# Patient Record
Sex: Female | Born: 1945
Health system: Southern US, Community
[De-identification: ages and names within clinical notes are randomized; demographics above are authoritative.]

## PROBLEM LIST (undated history)

## (undated) DIAGNOSIS — D353 Benign neoplasm of craniopharyngeal duct: Secondary | ICD-10-CM

## (undated) DIAGNOSIS — Z7189 Other specified counseling: Secondary | ICD-10-CM

## (undated) DIAGNOSIS — N200 Calculus of kidney: Secondary | ICD-10-CM

## (undated) DIAGNOSIS — Z87898 Personal history of other specified conditions: Secondary | ICD-10-CM

## (undated) DIAGNOSIS — M810 Age-related osteoporosis without current pathological fracture: Secondary | ICD-10-CM

## (undated) DIAGNOSIS — Q790 Congenital diaphragmatic hernia: Secondary | ICD-10-CM

## (undated) DIAGNOSIS — Z87442 Personal history of urinary calculi: Secondary | ICD-10-CM

## (undated) DIAGNOSIS — N390 Urinary tract infection, site not specified: Secondary | ICD-10-CM

## (undated) DIAGNOSIS — N6019 Diffuse cystic mastopathy of unspecified breast: Secondary | ICD-10-CM

## (undated) DIAGNOSIS — D352 Benign neoplasm of pituitary gland: Secondary | ICD-10-CM

## (undated) DIAGNOSIS — B0222 Postherpetic trigeminal neuralgia: Secondary | ICD-10-CM

## (undated) DIAGNOSIS — Z923 Personal history of irradiation: Secondary | ICD-10-CM

## (undated) DIAGNOSIS — M1990 Unspecified osteoarthritis, unspecified site: Secondary | ICD-10-CM

## (undated) DIAGNOSIS — N270 Small kidney, unilateral: Secondary | ICD-10-CM

## (undated) DIAGNOSIS — M26629 Arthralgia of temporomandibular joint, unspecified side: Secondary | ICD-10-CM

## (undated) DIAGNOSIS — B159 Hepatitis A without hepatic coma: Secondary | ICD-10-CM

## (undated) DIAGNOSIS — N189 Chronic kidney disease, unspecified: Secondary | ICD-10-CM

## (undated) DIAGNOSIS — R3129 Other microscopic hematuria: Secondary | ICD-10-CM

## (undated) DIAGNOSIS — E039 Hypothyroidism, unspecified: Secondary | ICD-10-CM

## (undated) DIAGNOSIS — E2740 Unspecified adrenocortical insufficiency: Secondary | ICD-10-CM

## (undated) DIAGNOSIS — E7800 Pure hypercholesterolemia, unspecified: Secondary | ICD-10-CM

## (undated) DIAGNOSIS — K219 Gastro-esophageal reflux disease without esophagitis: Secondary | ICD-10-CM

## (undated) DIAGNOSIS — D419 Neoplasm of uncertain behavior of unspecified urinary organ: Secondary | ICD-10-CM

## (undated) HISTORY — DX: Congenital diaphragmatic hernia: Q79.0

## (undated) HISTORY — DX: Other specified counseling: Z71.89

## (undated) HISTORY — DX: Personal history of other specified conditions: Z87.898

## (undated) HISTORY — DX: Pure hypercholesterolemia, unspecified: E78.00

## (undated) HISTORY — PX: COLONOSCOPY: SHX174

## (undated) HISTORY — DX: Small kidney, unilateral: N27.0

## (undated) HISTORY — DX: Other microscopic hematuria: R31.29

## (undated) HISTORY — DX: Age-related osteoporosis without current pathological fracture: M81.0

## (undated) HISTORY — PX: TONSILLECTOMY: SUR1361

## (undated) HISTORY — DX: Benign neoplasm of pituitary gland: D35.2

## (undated) HISTORY — DX: Unspecified adrenocortical insufficiency: E27.40

## (undated) HISTORY — DX: Benign neoplasm of craniopharyngeal duct: D35.3

## (undated) HISTORY — PX: HERNIA REPAIR: SHX51

## (undated) HISTORY — DX: Hypothyroidism, unspecified: E03.9

## (undated) HISTORY — PX: BRAIN SURGERY: SHX531

## (undated) HISTORY — DX: Neoplasm of uncertain behavior of unspecified urinary organ: D41.9

## (undated) HISTORY — DX: Personal history of irradiation: Z92.3

## (undated) HISTORY — DX: Urinary tract infection, site not specified: N39.0

## (undated) HISTORY — DX: Calculus of kidney: N20.0

---

## 1984-10-13 HISTORY — PX: BREAST BIOPSY: SHX20

## 2003-08-14 HISTORY — PX: NEPHRECTOMY RADICAL: SUR878

## 2003-10-14 DIAGNOSIS — D352 Benign neoplasm of pituitary gland: Secondary | ICD-10-CM

## 2003-10-14 HISTORY — DX: Benign neoplasm of pituitary gland: D35.2

## 2004-03-14 ENCOUNTER — Inpatient Hospital Stay (HOSPITAL_COMMUNITY): Admission: AD | Admit: 2004-03-14 | Discharge: 2004-03-20 | Payer: Self-pay | Admitting: Neurosurgery

## 2004-03-14 ENCOUNTER — Encounter (INDEPENDENT_AMBULATORY_CARE_PROVIDER_SITE_OTHER): Payer: Self-pay | Admitting: *Deleted

## 2004-03-14 HISTORY — PX: TRANSPHENOIDAL PITUITARY RESECTION: SHX2572

## 2004-04-08 ENCOUNTER — Encounter: Admission: RE | Admit: 2004-04-08 | Discharge: 2004-04-08 | Payer: Self-pay | Admitting: Neurosurgery

## 2004-07-17 ENCOUNTER — Encounter: Admission: RE | Admit: 2004-07-17 | Discharge: 2004-07-17 | Payer: Self-pay | Admitting: Neurosurgery

## 2004-12-23 ENCOUNTER — Ambulatory Visit: Payer: Self-pay | Admitting: Urology

## 2005-05-14 ENCOUNTER — Encounter: Admission: RE | Admit: 2005-05-14 | Discharge: 2005-05-14 | Payer: Self-pay | Admitting: Neurosurgery

## 2006-03-06 ENCOUNTER — Ambulatory Visit: Payer: Self-pay | Admitting: Urology

## 2006-06-18 ENCOUNTER — Encounter: Admission: RE | Admit: 2006-06-18 | Discharge: 2006-06-18 | Payer: Self-pay | Admitting: Neurosurgery

## 2007-04-21 ENCOUNTER — Ambulatory Visit: Payer: Self-pay | Admitting: Urology

## 2007-11-08 ENCOUNTER — Ambulatory Visit: Payer: Self-pay | Admitting: Urology

## 2008-01-05 ENCOUNTER — Encounter: Admission: RE | Admit: 2008-01-05 | Discharge: 2008-01-05 | Payer: Self-pay | Admitting: Neurosurgery

## 2008-07-12 ENCOUNTER — Ambulatory Visit: Payer: Self-pay | Admitting: Urology

## 2009-07-18 ENCOUNTER — Ambulatory Visit: Payer: Self-pay | Admitting: Urology

## 2009-12-13 ENCOUNTER — Ambulatory Visit: Payer: Self-pay | Admitting: Urology

## 2010-03-25 ENCOUNTER — Encounter: Admission: RE | Admit: 2010-03-25 | Discharge: 2010-03-25 | Payer: Self-pay | Admitting: Neurosurgery

## 2010-07-24 ENCOUNTER — Ambulatory Visit: Payer: Self-pay | Admitting: Urology

## 2010-09-16 ENCOUNTER — Encounter: Admission: RE | Admit: 2010-09-16 | Discharge: 2010-09-16 | Payer: Self-pay | Admitting: Neurosurgery

## 2011-02-28 NOTE — H&P (Signed)
Brianna Hammond, Brianna Hammond                        ACCOUNT NO.:  [id number]   MEDICAL RECORD NO.:  [id number]                   PATIENT TYPE:  OIB   LOCATION:  2899                                 FACILITY:  [facility]   PHYSICIAN:  Clydene Fake, M.D.               DATE OF BIRTH:  June 13, 1946   DATE OF ADMISSION:  03/14/2004  DATE OF DISCHARGE:                                HISTORY & PHYSICAL   CHIEF COMPLAINT:  Headaches, nausea, vomiting, vision changes.   HISTORY:  The patient is a 65 year old woman who, [place] Mar [place] 2004[place] started having severe sudden-onset headache, nausea, vomiting and  vision changes, taken to a local emergency room and CT was done showing a  pituitary mass, no subarachnoid hemorrhage.  LP was done showing negative  for subarachnoid hemorrhage and she was released.  She went back home to  [place], went to her medical doctor, who got the results of the CT.  She  was having progressive worsening vision problems with blurriness to the left  temporal visual field with continued nausea, vomiting and anorexia.  An MRI  was done and the patient was sent to [place] and admitted for a large pituitary  mass, but there is a hemorrhage, considerable, with pituitary apoplexy and  compression of the chiasm.   PAST MEDICAL HISTORY:  Past medical history is significant for:  1. A renal adenoma with 1 kidney removed in November of 2004.  2. Some sinus problems.  3. Hypothyroidism.   MEDICATIONS:  Medications include aspirin, Levothroid, Evista, Pepcid,  Lorcet p.r.n. pain, Allegra and had Levaquin for the last week, stopping  yesterday.   SOCIAL HISTORY:  Social history shows she is employed at [employer], is  married, does not smoke, uses alcohol socially.   DRUG ALLERGIES:  Drug allergies include CODEINE, which causes nausea and  hallucinations.   REVIEW OF SYSTEMS:  Review of systems otherwise negative.   FAMILY HISTORY:  Family history is noncontributory.   EXAM:  NEUROLOGIC:  Patient is awake, alert and oriented x3, appropriate  affect and normal fund of knowledge.  Cranial nerves II-XII were examined  and they were intact with extraocular movements intact, pupils reactive,  face symmetric, tongue midline.  Visual fields tested and technically intact  to confrontation.  There was decreased and blurred vision to the left  temporal visual field compared to the right more medial fields.  Motor  strength and sensation intact.  Gait normal.   DATA REVIEW:  MRI of brain and sella shows a large solid mass, rim-  enhancing, extending out superiorly into the hypothalamus, compressing the  chiasm, no hydrocephalus; some mixed signal, __________ T2 consistent with  hemorrhage.   ASSESSMENT AND PLAN:  Patient with pituitary microadenoma with hemorrhage  with worsening of vision, headache, nausea, vomiting, anorexia which  continues and may be worsening.  Patient admitted for surgical  decompression.  Clydene Fake, M.D.    JRH/MEDQ  D:  03/14/2004  T:  03/15/2004  Job:  [id number]

## 2011-02-28 NOTE — Op Note (Signed)
Brianna Hammond, SAVINO                        ACCOUNT NO.:  [id number]   MEDICAL RECORD NO.:  [id number]                   PATIENT TYPE:  OIB   LOCATION:  2899                                 FACILITY:  [facility]   PHYSICIAN:  Jefry H. Pollyann Kennedy, M.D.                DATE OF BIRTH:  March 26, 1946   DATE OF PROCEDURE:  03/14/2004  DATE OF DISCHARGE:                                 OPERATIVE REPORT   PREOPERATIVE DIAGNOSIS:  Pituitary mass and pituitary apoplexy.   POSTOPERATIVE DIAGNOSIS:  Pituitary mass and pituitary apoplexy.   PROCEDURE:  Trans-septal trans-sphenoidal approach to the pituitary.   SURGEON:  Jefry H. Pollyann Kennedy, M.D.   ASSISTANT:  Clydene Fake, M.D.   COMPLICATIONS:  None.   FINDINGS:  Nasal septal deviation, large sphenoid sinus with rightward  deflection of the sphenoid septum and thinning of the bone of the posterior  sphenoid sinus, with a large mass within the sella turcica with a yellow,  slightly necrotic appearance to it.   HISTORY:  This is a 65year-old lady who presented over the past two weeks  with severe headache and progressive worsening of visual field change.  The  risks, benefits, alternatives, and complications of this portion of the  procedure were explained to the patient who seemed to understand and agreed  to the surgery.   PROCEDURE:  The patient was taken to the operating room and placed on the  operating table in supine position.  The head was positioned on the  neurosurgical table and C-arm was placed into proper positioning for lateral  fluoroscopy.  Oxymetazoline spray was used preoperatively in the nasal  cavity.  The nose was prepped and draped in a standard fashion.  A columella  incision was outlined with a marking pen and 1% Xylocaine with epinephrine  was infiltrated into the columella and the septum bilaterally as well as the  inferior turbinates.  A left hemitransfixion incision was created with the  15 scalpel.  A columella incision  was then created with a #11 scalpel.  The  lower lateral cartilage was dissected off the nasal spine and reflected with  the columella and the septum off laterally to the right side.  Mucoperichondrial flap was developed down the left side.  The bony and  cartilaginous junction was divided and a similar flap was developed on the  right side.  Large fragments of ethmoid  plate were resected with Laren Boom rongeurs and preserved for later grafting.  The posterior portion  of the quadrangular cartilage was resected, as well, as it was deviated  toward the left.  The ethmoid and vomer were taken down all the way to the  sphenoid rostrum.  The Hardy speculum was placed into position exposing the  face of the sphenoid.  The sphenoid was opened anteriorly using a 4 mm  osteotome.  The mucosa was resected.  The opening was enlarged in  all  directions using Kerrison rongeurs.  After the sinus was opened widely and  the mucosa was stripped, the patient's care was then handed over to Dr.  Colon Branch.  He performed extirpation of the tumor.  Following that, he  placed a fragment of the ethmoid perpendicular plate bone within the sella  to cover the dura and then applied Surgicel and BioGlue in several layers.  The patient's care was then handed back to myself.  The septal flaps were  quilted with 4-0 plain gut.  The mucosal incision was reapproximated with 4-  0 chromic suture and the skin columella incision was reapproximated with  interrupted 6-0 nylon.  The inferior turbinates were out fractured with a  Therapist, nutritional to allow  for more sufficient room for packing.  The nasal cavities were suctioned of  blood and secretions and then packed with rolled up Telfa coated with  Bacitracin.  The pharynx was suctioned blood under direct visualization.  The patient was then transferred to the recovery room in satisfactory  condition.                                               Jefry H.  Pollyann Kennedy, M.D.    JHR/MEDQ  D:  03/15/2004  T:  03/15/2004  Job:  [id number]

## 2011-02-28 NOTE — Discharge Summary (Signed)
Brianna Hammond, Brianna Hammond                        ACCOUNT NO.:  [id number]   MEDICAL RECORD NO.:  [id number]                   PATIENT TYPE:  INP   LOCATION:  3105                                 FACILITY:  [facility]   PHYSICIAN:  Clydene Fake, M.D.               DATE OF BIRTH:  Apr 08, 1946   DATE OF ADMISSION:  03/14/2004  DATE OF DISCHARGE:  03/20/2004                                 DISCHARGE SUMMARY   DIAGNOSIS:  Pituitary adenoma with hemorrhage and vision changes.   PROCEDURE:  Transsphenoidal resection of pituitary tumor, microdissection  with microscope.   REASON FOR ADMISSION:  The patient is a 65 year old woman who had nine days  prior to admission severe onset of headache, nausea, vomiting, progressive  vision changes in left upper field with persistent nausea, vomiting and  anorexia.  MRI was done showing large pituitary tumor __________ with  hemorrhage.  The patient brought to the hospital for surgical decompression.   HOSPITAL COURSE:  The patient was admitted on March 14, 2004 and underwent  surgery by Dr. Pollyann Kennedy and assisted in surgery __________ approach.  Post  operation she was transferred to intensive care unit.  She woke up, was  awake, alert oriented x3.  Was able to follow commands.  Extraocular  movements are intact.  Pupils reactive and visual fields were intact to  confrontation.  The patient thought maybe possibly improved preop status.  We continued to watch her in the ICU and watch fluid status and watch sodium  __________ .  They were a little bit low and she was given some fluid  replacement to increase urine output and watching the sodium.  Neurologically, she remained intact and continued __________ intermittently  __________ better.  Laboratory remained stable and she was transferred to  stepdown unit on March 18, 2004.  On the 7th the urine seemed to stabilize.  Sodium was 134 and remained stable.  She neurologically remained stable.  Was up more active.   On the 8th, sodium 141.  There was no drainage in the  nasal area.  She was on Decadron on postoperatively __________ , very slow  and wean her Decadron.  Fluid status was stable and she was discharged home  in stable condition on March 20, 2004.  Very slow wean of Decadron and  followup in the office in 2-3 weeks with no strenuous activity.  Nasal spray  p.r.n.  Call Dr. Pollyann Kennedy in two weeks.                                                Clydene Fake, M.D.    JRH/MEDQ  D:  06/13/2004  T:  06/14/2004  Job:  [id number]

## 2011-02-28 NOTE — Op Note (Signed)
NAMENEEMA, BARREIRA                        ACCOUNT NO.:  [id number]   MEDICAL RECORD NO.:  [id number]                   PATIENT TYPE:  OIB   LOCATION:  2899                                 FACILITY:  [facility]   PHYSICIAN:  Clydene Fake, M.D.               DATE OF BIRTH:  1946-03-25   DATE OF PROCEDURE:  03/14/2004  DATE OF DISCHARGE:                                 OPERATIVE REPORT   PREOPERATIVE DIAGNOSIS:  Pituitary adenoma with hemorrhage and vision  changes.   POSTOPERATIVE DIAGNOSIS:  Pituitary adenoma with hemorrhage and vision  changes.   PROCEDURE:  Trans-sphenoidal resection of pituitary tumor, microdissection  with the microscope.   CO-SURGEONS:  Clydene Fake, M.D.  Jefry H. Pollyann Kennedy, M.D.   ANESTHESIA:  General endotracheal anesthesia.   ESTIMATED BLOOD LOSS:  Nil.   BLOOD REPLACED:  None.   DRAINS:  None.   COMPLICATIONS:  None.   PATHOLOGY:  Sent for permanent sections.   REASON FOR PROCEDURE:  The patient is a 65 year old woman who, nine days  ago, had severe onset of headache, nausea and vomiting, and started having  progressive vision changes of left temporal field with persistent nausea,  vomiting, and anorexia.  MRI was done showing large pituitary tumor  compressing the chiasm with hemorrhage.  The patient was brought in for  decompressive surgery.   PROCEDURE IN DETAIL:  The patient was brought into the operating room and  general anesthesia was induced.  The patient was prepped and draped in a  sterile fashion.  The belly was also prepped and draped for possible fat  graft.  Dr. Pollyann Kennedy then performed the approach, trans-sphenoidal approach  into the sphenoid sinus.  He will dictate that under separate dictation.  A  speculum was in place and we had fluoroscopy in place.  We placed suction in  the anterior and posterior margins and we were at the bottom of the sella.  Removing the posterior mucosa of the sphenoid sinus, very thin and eroded  bone of the sella was seen.  With a small curet, this was peeled off the  dura.  The dura was coagulated with bipolar.  The dura was then opened with  a 15 blade in a cruciate fashion.  The leaves of the dura were pulled back  using a hook and bipolar.  A yellowish, cheesy appearance of the tumor was  then seen.  We used a hook to enter the tumor and it was very soft in  consistency.  Ring curets were then used to remove the tumor along with  pituitary rongeurs.  The tumor was kept for specimen and at the end of the  case, sent for permanent section.  We continued carefully removing tumor  with the various ring curets and checking with fluoroscopy for our  positioning anterior and posterior.  As we continued to remove tumor,  reddish material consistent with a pituitary  gland was seen, there was some  tumor attached to it and carefully, we dissected some of that tumor off.  There was a very thin rim of tumor of the right aspect of the pituitary  gland.  A space in the sella kept collapsing as we removed tumor and when we  were finished, we seemed to have most of the tumor out looking at the  pituitary.  Valsalva was done, there was no CSF leak.  No further tumor was  able to be found.  The wound was irrigated with antibiotic solution.  Some  Surgicel was placed over the dural edge.  A piece of septal bone was placed  anterior to the dura.  Some more Gelfoam was placed around the bone edge and  BioGlue was then used to seal the area of the sphenoid sinus.  A couple of  more pieces of Surgicel were layered over that.  BioGlue was placed over  that.  At this point, the microscope was removed from the field, the  specimen was removed.  Dr. Pollyann Kennedy finished the closure of the trans-  sphenoidal approach which will be dictated under separate dictation.  When  he was finished, the patient was awakened from anesthesia and transferred to  the recovery room in stable condition.                                                Clydene Fake, M.D.    JRH/MEDQ  D:  03/14/2004  T:  03/14/2004  Job:  [id number]

## 2011-05-12 ENCOUNTER — Other Ambulatory Visit: Payer: Self-pay | Admitting: Neurosurgery

## 2011-05-12 DIAGNOSIS — D352 Benign neoplasm of pituitary gland: Secondary | ICD-10-CM

## 2011-06-11 ENCOUNTER — Ambulatory Visit
Admission: RE | Admit: 2011-06-11 | Discharge: 2011-06-11 | Disposition: A | Discharge status: Home / Self Care | Payer: 59 | Source: Ambulatory Visit | Attending: Neurosurgery | Admitting: Neurosurgery

## 2011-06-11 DIAGNOSIS — D352 Benign neoplasm of pituitary gland: Secondary | ICD-10-CM

## 2011-06-11 MED ORDER — GADOBENATE DIMEGLUMINE 529 MG/ML IV SOLN
7.0000 mL | Freq: Once | INTRAVENOUS | Status: AC | PRN
  Administered 2011-06-11: 7 mL via INTRAVENOUS

## 2011-09-17 ENCOUNTER — Ambulatory Visit: Payer: Self-pay | Admitting: Urology

## 2012-06-02 ENCOUNTER — Other Ambulatory Visit: Payer: Self-pay | Admitting: Neurosurgery

## 2012-06-02 DIAGNOSIS — D352 Benign neoplasm of pituitary gland: Secondary | ICD-10-CM

## 2012-06-02 DIAGNOSIS — D353 Benign neoplasm of craniopharyngeal duct: Secondary | ICD-10-CM

## 2012-06-30 ENCOUNTER — Ambulatory Visit
Admission: RE | Admit: 2012-06-30 | Discharge: 2012-06-30 | Disposition: A | Discharge status: Home / Self Care | Payer: Medicare Other | Source: Ambulatory Visit | Attending: Neurosurgery | Admitting: Neurosurgery

## 2012-06-30 DIAGNOSIS — D353 Benign neoplasm of craniopharyngeal duct: Secondary | ICD-10-CM

## 2012-06-30 DIAGNOSIS — D352 Benign neoplasm of pituitary gland: Secondary | ICD-10-CM

## 2012-06-30 MED ORDER — GADOBENATE DIMEGLUMINE 529 MG/ML IV SOLN
7.0000 mL | Freq: Once | INTRAVENOUS | Status: AC | PRN
  Administered 2012-06-30: 7 mL via INTRAVENOUS

## 2012-11-15 DIAGNOSIS — D419 Neoplasm of uncertain behavior of unspecified urinary organ: Secondary | ICD-10-CM

## 2012-11-15 DIAGNOSIS — N200 Calculus of kidney: Secondary | ICD-10-CM | POA: Insufficient documentation

## 2012-11-15 DIAGNOSIS — D352 Benign neoplasm of pituitary gland: Secondary | ICD-10-CM | POA: Insufficient documentation

## 2012-11-15 DIAGNOSIS — D353 Benign neoplasm of craniopharyngeal duct: Secondary | ICD-10-CM

## 2012-11-15 DIAGNOSIS — R3129 Other microscopic hematuria: Secondary | ICD-10-CM

## 2012-11-15 DIAGNOSIS — N390 Urinary tract infection, site not specified: Secondary | ICD-10-CM | POA: Insufficient documentation

## 2012-11-15 HISTORY — DX: Benign neoplasm of pituitary gland: D35.2

## 2012-11-15 HISTORY — DX: Calculus of kidney: N20.0

## 2012-11-15 HISTORY — DX: Other microscopic hematuria: R31.29

## 2012-11-15 HISTORY — DX: Neoplasm of uncertain behavior of unspecified urinary organ: D41.9

## 2012-11-15 HISTORY — DX: Urinary tract infection, site not specified: N39.0

## 2012-11-17 ENCOUNTER — Ambulatory Visit: Payer: Self-pay | Admitting: Urology

## 2013-06-21 ENCOUNTER — Ambulatory Visit: Payer: Self-pay | Admitting: Family Medicine

## 2013-07-27 ENCOUNTER — Other Ambulatory Visit: Payer: Self-pay | Admitting: Neurosurgery

## 2013-07-27 DIAGNOSIS — D352 Benign neoplasm of pituitary gland: Secondary | ICD-10-CM

## 2013-08-04 ENCOUNTER — Ambulatory Visit
Admission: RE | Admit: 2013-08-04 | Discharge: 2013-08-04 | Disposition: A | Discharge status: Home / Self Care | Payer: Medicare Other | Source: Ambulatory Visit | Attending: Neurosurgery | Admitting: Neurosurgery

## 2013-08-04 DIAGNOSIS — D352 Benign neoplasm of pituitary gland: Secondary | ICD-10-CM

## 2013-08-04 MED ORDER — GADOBENATE DIMEGLUMINE 529 MG/ML IV SOLN
6.0000 mL | Freq: Once | INTRAVENOUS | Status: AC | PRN
  Administered 2013-08-04: 6 mL via INTRAVENOUS

## 2013-11-13 DEATH — deceased

## 2013-11-16 ENCOUNTER — Ambulatory Visit: Payer: Self-pay | Admitting: Urology

## 2014-08-16 ENCOUNTER — Ambulatory Visit: Payer: Self-pay | Admitting: Family Medicine

## 2014-10-19 ENCOUNTER — Other Ambulatory Visit: Payer: Self-pay | Admitting: Radiation Therapy

## 2014-10-19 DIAGNOSIS — D352 Benign neoplasm of pituitary gland: Secondary | ICD-10-CM

## 2014-10-26 ENCOUNTER — Encounter: Payer: Self-pay | Admitting: Radiation Oncology

## 2014-10-26 ENCOUNTER — Other Ambulatory Visit: Payer: Self-pay | Admitting: Radiation Therapy

## 2014-10-26 DIAGNOSIS — D352 Benign neoplasm of pituitary gland: Secondary | ICD-10-CM

## 2014-10-26 NOTE — Progress Notes (Signed)
Location/Histology of Brain Tumor: pituitary adenoma, dx June 2005  Patient presented with symptoms of:  sudden severe headache with nausea  Past or anticipated interventions, if any, per neurosurgery: 03/14/2004 Transsphenoidal resection of pituitary adenoma with hemorrhage and vision changes. He's been following her > 10 yrs with serial MRI's.   Past or anticipated interventions, if any, per medical oncology: none  Dose of Decadron, if applicable: na  Recent neurologic symptoms, if any:   Seizures: no  Headaches: no  Nausea: no  Dizziness/ataxia: no  Difficulty with hand coordination: no  Focal numbness/weakness: no  Visual deficits/changes: Post op- Decreased vision in right eye, post op shingles involving right eye  Confusion/Memory deficits: no  Painful bone metastases at present, if any: no  SAFETY ISSUES:  Prior radiation? no  Pacemaker/ICD? no  Possible current pregnancy? no  Is the patient on methotrexate? no  Additional Complaints / other details: originally a patient of Dr Hazle Coca; she presented with pituitary apoplexy June 2005 Married, retired from [employer] in [employer], engineering

## 2014-10-27 ENCOUNTER — Ambulatory Visit
Admission: RE | Admit: 2014-10-27 | Discharge: 2014-10-27 | Disposition: A | Discharge status: Home / Self Care | Payer: Medicare Other | Source: Ambulatory Visit | Attending: Radiation Oncology | Admitting: Radiation Oncology

## 2014-10-27 DIAGNOSIS — D352 Benign neoplasm of pituitary gland: Secondary | ICD-10-CM

## 2014-10-27 MED ORDER — GADOBENATE DIMEGLUMINE 529 MG/ML IV SOLN
10.0000 mL | Freq: Once | INTRAVENOUS | Status: AC | PRN
  Administered 2014-10-27: 10 mL via INTRAVENOUS

## 2014-10-27 NOTE — Progress Notes (Signed)
Radiation Oncology         [phone]) [phone] ________________________________  Initial outpatient Consultation  Name: Brianna Hammond MRN: [id number]  Date: 10/30/2014  DOB: 01-Nov-1945  WI:[id number],EQASTM  Hosie Spangle, MD   REFERRING PHYSICIAN: Hosie Spangle, MD  DIAGNOSIS:    ICD-9-CM ICD-10-CM   1. Pituitary adenoma 227.3 D35.2     HISTORY OF PRESENT ILLNESS::Brianna Hammond is a 69 y.o. female who presented with a pituitary adenoma, dx June 2005. She presented with symptoms of:  sudden severe headache with nausea in the middle of the night; she allow had decreased peripheral vision. She was diagnosed with pituitary apoplexy. On 03/14/2004, performed a Transsphenoidal resection of pituitary adenoma and her peripheral vision deficit immediately resolved.   She has since undergone serial MRI's.  Dr Sherwood Gambler has been following her since Dr Luiz Ochoa changed practices. Over time there has been gradual tumor progression. Is it contacting the left optic nerve.   She reports decreased vision in right eye, due to chronic problems from post op shingles involving her right eye.  My understanding from her history and review of records is that this was a non-secreting adenoma.   PREVIOUS RADIATION THERAPY: No  PAST MEDICAL HISTORY:  has a past medical history of Pituitary adenoma (2005); Kidney calculus; Trigeminal herpes zoster; TMJ syndrome; Arthritis; GERD (gastroesophageal reflux disease); Hepatitis A; Hypothyroid; Osteoporosis; and Fibrocystic breast disease.    PAST SURGICAL HISTORY: Past Surgical History  Procedure Laterality Date  . Tonsillectomy      age 34  . Breast biopsy Left 1986  . Nephrectomy radical Left 08/2003    for metanephric adenoma  . Transphenoidal pituitary resection  03/14/2004    infarcted pituitary tumor with features most suggestive of pituitary adenoma    FAMILY HISTORY: family history includes Cancer in her mother; Lymphoma in her  mother.  SOCIAL HISTORY:  reports that she has never smoked. She does not have any smokeless tobacco history on file. She reports that she drinks alcohol. She reports that she does not use illicit drugs.  ALLERGIES: Decadron  MEDICATIONS:  Current Outpatient Prescriptions  Medication Sig Dispense Refill  . Calcium-Magnesium-Vitamin D [id number] MG-MG-UNIT TB24 Take by mouth.    . Cholecalciferol 50000 UNITS capsule Take by mouth.    . Cyanocobalamin (VITAMIN B-12 CR) 1000 MCG TBCR Take by mouth.    . famotidine (PEPCID) 20 MG tablet Take 20 mg by mouth.    . levothyroxine (SYNTHROID, LEVOTHROID) 50 MCG tablet Take by mouth.    . potassium citrate (UROCIT-K) 10 MEQ (1080 MG) SR tablet Take by mouth.    . vitamin E 400 UNIT capsule Take by mouth.     No current facility-administered medications for this encounter.    REVIEW OF SYSTEMS:  Notable for that above.   PHYSICAL EXAM:  height is 5' (1.524 m) and weight is 115 lb 14.4 oz (52.572 kg). Her oral temperature is 97.7 F (36.5 C). Her blood pressure is 133/76 and her pulse is 70. Her respiration is 20.   General: Alert and oriented, in no acute distress HEENT: Head is normocephalic. Extraocular movements are intact. Oropharynx is clear. Neck: Neck is supple, no palpable cervical or supraclavicular lymphadenopathy. Heart: Regular in rate and rhythm with no murmurs, rubs, or gallops. Chest: Clear to auscultation bilaterally, with no rhonchi, wheezes, or rales. Abdomen: Soft, nontender, nondistended, with no rigidity or guarding. Extremities: No cyanosis or edema. Lymphatics: see Neck Exam Skin: No concerning lesions. Musculoskeletal: symmetric strength  and muscle tone throughout. Neurologic: Vision grossly intact in all quadrants. Cranial nerves II through XII are grossly intact. No obvious focalities. Speech is fluent. Coordination is intact. Psychiatric: Judgment and insight are intact. Affect is appropriate.    ECOG = 0  0 -  Asymptomatic (Fully active, able to carry on all predisease activities without restriction)  1 - Symptomatic but completely ambulatory (Restricted in physically strenuous activity but ambulatory and able to carry out work of a light or sedentary nature. For example, light housework, office work)  2 - Symptomatic, <50% in bed during the day (Ambulatory and capable of all self care but unable to carry out any work activities. Up and about more than 50% of waking hours)  3 - Symptomatic, >50% in bed, but not bedbound (Capable of only limited self-care, confined to bed or chair 50% or more of waking hours)  4 - Bedbound (Completely disabled. Cannot carry on any self-care. Totally confined to bed or chair)  5 - Death   Eustace Pen MM, Creech RH, Tormey DC, et al. [phone]). "Toxicity and response criteria of the [facility]". [place] Oncol. 5 (6): 649-55   LABORATORY DATA:  No results found for: WBC, HGB, HCT, MCV, PLT CMP  No results found for: NA, K, CL, CO2, GLUCOSE, BUN, CREATININE, CALCIUM, PROT, ALBUMIN, AST, ALT, ALKPHOS, BILITOT, GFRNONAA, GFRAA    No results found for: TSH  PATHOLOGY: REPORT OF SURGICAL PATHOLOGY  Case #: [id number] Patient Name: Brianna Hammond PID: [id number] Pathologist: Neldon Mc, MD DOB/Age [birth year] (Age: 44) Gender: F Date Taken: 03/14/2004 Date Received: 03/15/2004  FINAL DIAGNOSIS   MICROSCOPIC EXAMINATION AND DIAGNOSIS   PITUITARY TUMOR, EXCISION: INFARCTED PITUITARY TUMOR WITH FEATURES MOST SUGGESTIVE OF PITUITARY ADENOMA.  COMMENT ONCOLOGY TABLE-BRAIN AND SPINAL CORD  1. Maximum tumor size (cm): Unknown, needs radiographic correlation 2. Tumor location: Pituitary gland 3. Histology: Infarcted pituitary neoplasm most suggestive of pituitary adenoma 4. Grade: N/A 5. Margins (if applicable): Cannot be assessed. 6. Comment: Most of the tissue shows hemorrhagic infarction. However there are a few  fragments that show some preserved tumor cells that have a polygonal appearance and are with a prominent vascular network consistent with that seen in pituitary adenoma in the appropriate clinical setting. Therefore clinical correlation is strongly recommended. (JAS:caf 03/18/04)   RADIOGRAPHY: Mr Kizzie Fantasia Contrast  10/27/2014   CLINICAL DATA:  History of pituitary tumor resection in January of 2005, with subsequent recurrence documented more recently.  EXAM: MRI HEAD WITHOUT AND WITH CONTRAST  TECHNIQUE: Multiplanar, multiecho pulse sequences of the brain and surrounding structures were obtained without and with intravenous contrast. SRS protocol was employed.  CONTRAST:  63mL MULTIHANCE GADOBENATE DIMEGLUMINE 529 MG/ML IV SOLN  COMPARISON:  08/03/2014 MR brain most recent. Previous scans also compared From March 2009, December 2011, and October 2014.  FINDINGS: Residual/recurrent tumor within the sella turcica on the LEFT extending into the LEFT cavernous sinus is redemonstrated. RIGHT to LEFT dimensions of 11 mm, craniocaudal dimension of 8 mm, and anterior posterior dimension of 8 mm appears roughly unchanged. Slight tumor in the suprasellar cistern without chiasmatic compression, although the LEFT optic nerve appears mildly elevated. Tumor in the LEFT cavernous sinus does not significantly bulge that structure outward.  Trans-sphenoidal postop changes are redemonstrated with fat graft. Minimally deviated pituitary stalk to the RIGHT which enhances normally. Normal RIGHT cavernous sinus.  Ventricle size is normal. No acute or chronic cerebral ischemia. Mild atrophy with minimal small  vessel disease. Calvarium intact. No acute sinus or mastoid disease. Negative orbits.  IMPRESSION: Residual pituitary macroadenoma approximately 11 x 8 x 8 mm (R-L x A-P x C-C). Slight tremor in the suprasellar cistern abuts the LEFT optic nerve. The appearance is roughly similar to that of October 2015.  Slow interval  growth when compared with previous scans dating as far back is 2009 and 2011 however   Electronically Signed   By: Rolla Flatten M.D.   On: 10/27/2014 11:51      IMPRESSION/PLAN:     This is a lovely 69 yo woman with a pituitary macroadenoma, with interval progression since resection in 2005.  Marland Kitchen   After surgery in 2005 it has grown and poses future potential problems to her vision as it is in close proximity to her optic structures, particularly the left optic nerve. Further surgery is not recommended by Dr. Sherwood Gambler. He has drawn labs for her hormonal levels in his office and has discussed [place] with her as a salvage option.  I concur with his recommendation for stereotactic radiosurgery, which I would prescribe to be 25 Gy in 5 fractions, limiting the optic nerves and chiasm to 20Gy in 5 fractions, to minimize risk of visual side effects.   It was a pleasure meeting the patient today. We discussed the risks, benefits, and side effects of radiotherapy. No guarantees of treatment were given. We discussed acute side effects which could include headaches from the stereotactic mask and fatigue. We discussed other potential side effects such as temporary patchy hair loss. Long-term hormonal disturbances are not uncommon and therefore Dr Sherwood Gambler has ordered baseline lab tests. She is on levothyroxine. We talked about much less common side effects such as visual defects, brain injury. We discussed the technology that is used to minimize serious side effects. A consent form was signed and placed in the patient's medical record. The patient is enthusiastic about proceeding with treatment. I look forward to participating in the patient's care. Simulation to occur today.  __________________________________________   Eppie Gibson, MD

## 2014-10-30 ENCOUNTER — Ambulatory Visit
Admission: RE | Admit: 2014-10-30 | Discharge: 2014-10-30 | Disposition: A | Discharge status: Home / Self Care | Payer: Medicare Other | Source: Ambulatory Visit | Attending: Radiation Oncology | Admitting: Radiation Oncology

## 2014-10-30 ENCOUNTER — Encounter: Payer: Self-pay | Admitting: Radiation Oncology

## 2014-10-30 VITALS — BP 133/76 | HR 70 | Temp 97.7°F | Resp 20 | Ht 60.0 in | Wt 115.9 lb

## 2014-10-30 DIAGNOSIS — Z510 Encounter for antineoplastic radiation therapy: Secondary | ICD-10-CM | POA: Diagnosis not present

## 2014-10-30 DIAGNOSIS — Z905 Acquired absence of kidney: Secondary | ICD-10-CM | POA: Diagnosis not present

## 2014-10-30 DIAGNOSIS — D352 Benign neoplasm of pituitary gland: Secondary | ICD-10-CM | POA: Insufficient documentation

## 2014-10-30 DIAGNOSIS — Z807 Family history of other malignant neoplasms of lymphoid, hematopoietic and related tissues: Secondary | ICD-10-CM | POA: Insufficient documentation

## 2014-10-30 DIAGNOSIS — K219 Gastro-esophageal reflux disease without esophagitis: Secondary | ICD-10-CM | POA: Diagnosis not present

## 2014-10-30 DIAGNOSIS — E039 Hypothyroidism, unspecified: Secondary | ICD-10-CM | POA: Insufficient documentation

## 2014-10-30 HISTORY — DX: Diffuse cystic mastopathy of unspecified breast: N60.19

## 2014-10-30 HISTORY — DX: Benign neoplasm of pituitary gland: D35.2

## 2014-10-30 HISTORY — DX: Arthralgia of temporomandibular joint, unspecified side: M26.629

## 2014-10-30 HISTORY — DX: Postherpetic trigeminal neuralgia: B02.22

## 2014-10-30 HISTORY — DX: Gastro-esophageal reflux disease without esophagitis: K21.9

## 2014-10-30 HISTORY — DX: Hypothyroidism, unspecified: E03.9

## 2014-10-30 HISTORY — DX: Hepatitis a without hepatic coma: B15.9

## 2014-10-30 HISTORY — DX: Age-related osteoporosis without current pathological fracture: M81.0

## 2014-10-30 HISTORY — DX: Calculus of kidney: N20.0

## 2014-10-30 HISTORY — DX: Unspecified osteoarthritis, unspecified site: M19.90

## 2014-10-30 MED ORDER — SODIUM CHLORIDE 0.9 % IJ SOLN
10.0000 mL | Freq: Once | INTRAMUSCULAR | Status: AC
  Administered 2014-10-30: 10 mL via INTRAVENOUS

## 2014-10-30 NOTE — Progress Notes (Signed)
Please see the Nurse Progress Note in the MD Initial Consult Encounter for this patient. 

## 2014-10-30 NOTE — Progress Notes (Signed)
  Radiation Oncology         [phone]) [phone] ________________________________  Name: Brianna Hammond MRN: [id number]  Date: 10/30/2014  DOB: Mar 12, 1946  SIMULATION AND TREATMENT PLANNING NOTE    ICD-9-CM ICD-10-CM   1. Pituitary adenoma 227.3 D35.2     DIAGNOSIS: pituitary adenoma  NARRATIVE:  The patient was brought to the [facility].  Identity was confirmed.  All relevant records and images related to the planned course of therapy were reviewed.  The patient freely provided informed written consent to proceed with treatment after reviewing the details related to the planned course of therapy. The consent form was witnessed and verified by the simulation staff. Intravenous access was established for contrast administration. Then, the patient was set-up in a stable reproducible supine position for radiation therapy.  A relocatable thermoplastic stereotactic head frame was fabricated for precise immobilization.  CT images were obtained.  Surface markings were placed.  The CT images were loaded into the planning software and fused with the patient's targeting MRI scan.  Then the target and avoidance structures were contoured.  Treatment planning then occurred.  The radiation prescription was entered and confirmed.  I have requested 3D planning  I have requested a DVH of the following structures: Brain stem, brain, left eye, right eye, lenses, optic chiasm, target volumes, uninvolved brain, and normal tissue.    PLAN:  The patient will receive 25 Gy in 5 fractions   -----------------------------------  Eppie Gibson, MD

## 2014-10-30 NOTE — Progress Notes (Signed)
Removed right wrist 22 gauge IV. Catheter intact upon removal. Patient tolerated well. Applied an occlusive dressing to old IV site.

## 2014-11-01 DIAGNOSIS — Z510 Encounter for antineoplastic radiation therapy: Secondary | ICD-10-CM | POA: Diagnosis not present

## 2014-11-06 ENCOUNTER — Encounter: Payer: Self-pay | Admitting: Radiation Oncology

## 2014-11-06 ENCOUNTER — Ambulatory Visit
Admission: RE | Admit: 2014-11-06 | Discharge: 2014-11-06 | Disposition: A | Discharge status: Home / Self Care | Payer: Medicare Other | Source: Ambulatory Visit | Attending: Radiation Oncology | Admitting: Radiation Oncology

## 2014-11-06 VITALS — BP 125/71 | HR 66 | Temp 97.9°F | Resp 20

## 2014-11-06 DIAGNOSIS — Z510 Encounter for antineoplastic radiation therapy: Secondary | ICD-10-CM | POA: Diagnosis not present

## 2014-11-06 DIAGNOSIS — D352 Benign neoplasm of pituitary gland: Secondary | ICD-10-CM

## 2014-11-06 NOTE — Op Note (Signed)
Stereotactic Radiosurgery Operative Note  Name: Adela Esteban MRN: [id number]  Date: 11/06/2014  DOB: November 15, 1945  Op Note  Pre Operative Diagnosis:  Recurrent pituitary tumor  Post Operative Diagnois:  Recurrent pituitary tumor  3D TREATMENT PLANNING AND DOSIMETRY:  The patient's radiation plan was reviewed and approved by myself (neurosurgery) and Dr. Eppie Gibson (radiation oncology) prior to treatment.  It showed 3-dimensional radiation distributions overlaid onto the planning CT/MRI image set.  The [facility] for the target structures as well as the organs at risk were reviewed. The documentation of the 3D plan and dosimetry are filed in the radiation oncology EMR.  NARRATIVE:  Daleysa Kristiansen was brought to the TrueBeam stereotactic radiation treatment machine and placed supine on the CT couch. The head frame was applied, and the patient was set up for stereotactic radiosurgery.  I was present for the set-up and delivery.  SIMULATION VERIFICATION:  In the couch zero-angle position, the patient underwent Exactrac imaging using the Brainlab system with orthogonal KV images.  These were carefully aligned and repeated to confirm treatment position for each of the isocenters.  The Exactrac snap film verification was repeated at each couch angle.  SPECIAL TREATMENT PROCEDURE: Claudius Sis received stereotactic radiosurgery to the recurrent pituitary tumor. The tumor was treated using 3 Rapid Arc VMAT Beams to a prescription dose of 5 Gy (out of a total dose of 25 Gy divided in five 5 Gy fractions). ExacTrac registration was performed for each couch angle. The 100% isodose line was prescribed.   STEREOTACTIC TREATMENT MANAGEMENT:  Following delivery, the patient was transported to nursing in stable condition and monitored for possible acute effects.  Vital signs were recorded BP 126/88 mmHg  Pulse 64  Temp(Src) 97.9 F (36.6 C)  Resp 20. The patient tolerated treatment without  significant acute effects, and was discharged to home in stable condition.    PLAN: Follow-up in one month.

## 2014-11-06 NOTE — Progress Notes (Addendum)
Patient ID: Brianna Hammond, female   DOB: 09-30-1946, 69 y.o.   MRN: [id number]   Radiation Oncology         [phone] ________________________________  Stereotactic Treatment Procedure Note  Name: Brianna Hammond MRN: [id number]  Date: 11/06/2014  DOB: 1946/07/02  SPECIAL TREATMENT PROCEDURE    ICD-9-CM ICD-10-CM   1. Pituitary adenoma 227.3 D35.2      3D TREATMENT PLANNING AND DOSIMETRY:  The patient's radiation plan was reviewed and approved by neurosurgery and radiation oncology prior to treatment.  It showed 3-dimensional radiation distributions overlaid onto the planning CT/MRI image set.  The [facility] for the target structures as well as the organs at risk were reviewed. The documentation of the 3D plan and dosimetry are filed in the radiation oncology EMR.  NARRATIVE:  Brianna Hammond was brought to the TrueBeam stereotactic radiation treatment machine and placed supine on the CT couch. The head frame was applied, and the patient was set up for stereotactic radiosurgery.  Neurosurgery was present for the set-up and delivery  SIMULATION VERIFICATION:  In the couch zero-angle position, the patient underwent Exactrac imaging using the Brainlab system with orthogonal KV images.  These were carefully aligned and repeated to confirm treatment position for each of the isocenters.  The Exactrac snap film verification was repeated at each couch angle.  SPECIAL TREATMENT PROCEDURE: Brianna Hammond received stereotactic radiosurgery to the following targets: Pituitary adenoma target was treated using 3 Rapid Arc VMAT Beams to a prescription dose of 5 Gy (plan is to receive a total of 25 Gy in 5 fractions).  ExacTrac Snap verification was performed for each couch angle.   This constitutes a special treatment procedure due to the ablative dose delivered and the technical nature of treatment.  This highly technical modality of treatment ensures that the ablative dose is  centered on the patient's tumor while sparing normal tissues from excessive dose and risk of detrimental effects.  STEREOTACTIC TREATMENT MANAGEMENT:  Following delivery, the patient was transported to nursing in stable condition and monitored for possible acute effects.  Vital signs were recorded BP 125/71 mmHg  Pulse 66  Temp(Src) 97.9 F (36.6 C)  Resp 20. The patient tolerated treatment without significant acute effects, and was discharged to home in stable condition.    PLAN: Follow-up as scheduled for next fraction. ________________________________   Eppie Gibson, MD

## 2014-11-06 NOTE — Progress Notes (Addendum)
Patient resting quietly in recliner in rm 1 following #1 SRS treatment to pituitary gland. Husband at her side. Patient denies pain, HA, nausea, dizziness, vision changes. She states she feels "like you do when you first stand up while you are getting your bearings". She states the mask pressed on her left temple area below her ear. She has hx TMJ and states this area is slightly 'sore' from mask. VS stable. Pt to remain x 15 minutes.  3:37 pm VS stable. Pt denies pain, nausea, HA, dizziness, vision changes. She is d/c home, ambulatory. Dr Sherwood Gambler spoke with pt and spouse prior to her leaving.

## 2014-11-08 ENCOUNTER — Ambulatory Visit
Admission: RE | Admit: 2014-11-08 | Discharge: 2014-11-08 | Disposition: A | Discharge status: Home / Self Care | Payer: Medicare Other | Source: Ambulatory Visit | Attending: Radiation Oncology | Admitting: Radiation Oncology

## 2014-11-08 VITALS — BP 117/70 | HR 70 | Temp 97.8°F | Resp 20

## 2014-11-08 DIAGNOSIS — Z510 Encounter for antineoplastic radiation therapy: Secondary | ICD-10-CM | POA: Diagnosis not present

## 2014-11-08 DIAGNOSIS — D352 Benign neoplasm of pituitary gland: Secondary | ICD-10-CM

## 2014-11-08 NOTE — Progress Notes (Signed)
Patient ID: Brianna Hammond, female   DOB: 20-Feb-1946, 69 y.o.   MRN: [id number]   Radiation Oncology         [phone] ________________________________  Stereotactic Treatment Procedure Note  Name: Brianna Hammond MRN: [id number]  Date: 11/08/2014  DOB: July 04, 1946  SPECIAL TREATMENT PROCEDURE    ICD-9-CM ICD-10-CM   1. Pituitary adenoma 227.3 D35.2      3D TREATMENT PLANNING AND DOSIMETRY:  The patient's radiation plan was reviewed and approved by neurosurgery and radiation oncology prior to treatment.  It showed 3-dimensional radiation distributions overlaid onto the planning CT/MRI image set.  The [facility] for the target structures as well as the organs at risk were reviewed. The documentation of the 3D plan and dosimetry are filed in the radiation oncology EMR.  NARRATIVE:  Brianna Hammond was brought to the TrueBeam stereotactic radiation treatment machine and placed supine on the CT couch. The head frame was applied, and the patient was set up for stereotactic radiosurgery.  Neurosurgery was present for the set-up and delivery  SIMULATION VERIFICATION:  In the couch zero-angle position, the patient underwent Exactrac imaging using the Brainlab system with orthogonal KV images.  These were carefully aligned and repeated to confirm treatment position for each of the isocenters.  The Exactrac snap film verification was repeated at each couch angle.  SPECIAL TREATMENT PROCEDURE: Brianna Hammond received stereotactic radiosurgery to the following targets: Pituitary adenoma target was treated using 3 Rapid Arc VMAT Beams to a prescription dose of 5 Gy (plan is to receive a total of 25 Gy in 5 fractions).  ExacTrac Snap verification was performed for each couch angle.   This constitutes a special treatment procedure due to the ablative dose delivered and the technical nature of treatment.  This highly technical modality of treatment ensures that the ablative dose is  centered on the patient's tumor while sparing normal tissues from excessive dose and risk of detrimental effects.  STEREOTACTIC TREATMENT MANAGEMENT:  Following delivery, the patient was transported to nursing in stable condition and monitored for possible acute effects.  Vital signs were recorded BP 117/70 mmHg  Pulse 70  Temp(Src) 97.8 F (36.6 C)  Resp 20. The patient tolerated treatment without significant acute effects, and was discharged to home in stable condition.    PLAN: Follow-up as scheduled for next fraction. ________________________________   Eppie Gibson, MD

## 2014-11-08 NOTE — Progress Notes (Signed)
Patient in rm 1 following SRS treatment #2 to pituitary. She is resting quietly in the recliner. She denies pain, HA, nausea, dizziness. She states "I feel better than I did the last time." Spouse at her side. VS stable; drinking water.   3:15 pm VS stable, pt denies HA, , pain, nausea, dizziness. D/c home ambulatory; husband at her side.

## 2014-11-10 ENCOUNTER — Encounter: Payer: Self-pay | Admitting: Radiation Oncology

## 2014-11-10 ENCOUNTER — Ambulatory Visit
Admission: RE | Admit: 2014-11-10 | Discharge: 2014-11-10 | Disposition: A | Discharge status: Home / Self Care | Payer: Medicare Other | Source: Ambulatory Visit | Attending: Radiation Oncology | Admitting: Radiation Oncology

## 2014-11-10 VITALS — BP 125/71 | HR 76 | Temp 97.8°F | Resp 20

## 2014-11-10 DIAGNOSIS — Z510 Encounter for antineoplastic radiation therapy: Secondary | ICD-10-CM | POA: Diagnosis not present

## 2014-11-10 DIAGNOSIS — D352 Benign neoplasm of pituitary gland: Secondary | ICD-10-CM

## 2014-11-10 NOTE — Progress Notes (Signed)
Patient in room 2 following SRS to her pituitary. VS stable. She denies pain, HA, nausea, dizziness, vision changes, fatigue. She will remain 15 minutes. Husband by her side.

## 2014-11-13 ENCOUNTER — Ambulatory Visit
Admission: RE | Admit: 2014-11-13 | Discharge: 2014-11-13 | Disposition: A | Discharge status: Home / Self Care | Payer: Medicare Other | Source: Ambulatory Visit | Attending: Radiation Oncology | Admitting: Radiation Oncology

## 2014-11-13 VITALS — BP 131/77 | HR 79 | Temp 98.1°F

## 2014-11-13 DIAGNOSIS — Z510 Encounter for antineoplastic radiation therapy: Secondary | ICD-10-CM | POA: Diagnosis not present

## 2014-11-13 DIAGNOSIS — D352 Benign neoplasm of pituitary gland: Secondary | ICD-10-CM

## 2014-11-13 NOTE — Addendum Note (Signed)
Encounter addended by: Arlyss Repress, RN on: 11/13/2014 12:46 PM<BR>     Documentation filed: Inpatient Document Flowsheet, [place] Section

## 2014-11-13 NOTE — Addendum Note (Signed)
Encounter addended by: Arlyss Repress, RN on: 11/13/2014 12:51 PM<BR>     Documentation filed: Medications, Notes Section, Chief Complaint Section

## 2014-11-13 NOTE — Progress Notes (Signed)
Patient s/p 4 out of 5 SRS treatments.Denies pain, or nausea.No medication changes.Last treatment scheduled for Wednesday 11/15/14.Knows to call if any questions of concerns.

## 2014-11-13 NOTE — Progress Notes (Signed)
  Radiation Oncology [phone] ________________________________  Stereotactic Treatment Procedure Note  Name: Brianna Plumb MurrayMRN: [id number] Date: 1/29/2016DOB: 1945/11/16  SPECIAL TREATMENT PROCEDURE    ICD-9-CM ICD-10-CM   1. Pituitary adenoma 227.3 D35.2      3D TREATMENT PLANNING AND DOSIMETRY: The patient's radiation plan was reviewed and approved by neurosurgery and radiation oncology prior to treatment. It showed 3-dimensional radiation distributions overlaid onto the planning CT/MRI image set. The [facility] for the target structures as well as the organs at risk were reviewed. The documentation of the 3D plan and dosimetry are filed in the radiation oncology EMR.  NARRATIVE: Brianna Hammond was brought to the TrueBeam stereotactic radiation treatment machine and placed supine on the CT couch. The head frame was applied, and the patient was set up for stereotactic radiosurgery. Neurosurgery was present for the set-up and delivery  SIMULATION VERIFICATION: In the couch zero-angle position, the patient underwent Exactrac imaging using the Brainlab system with orthogonal KV images. These were carefully aligned and repeated to confirm treatment position for each of the isocenters. The Exactrac snap film verification was repeated at each couch angle.  SPECIAL TREATMENT PROCEDURE: Brianna Hammond received stereotactic radiosurgery to the following targets: Pituitary adenoma target was treated using 3 Rapid Arc VMAT Beams to a prescription dose of 5 Gy (plan is to receive a total of 25 Gy in 5 fractions). ExacTrac Snap verification was performed for each couch angle.   This constitutes a special treatment procedure due to the ablative dose delivered and the technical nature of treatment. This highly technical modality of treatment ensures that the ablative dose is centered on the patient's tumor while sparing  normal tissues from excessive dose and risk of detrimental effects.  STEREOTACTIC TREATMENT MANAGEMENT: Following delivery, the patient was transported to nursing in stable condition and monitored for possible acute effects. The patient tolerated treatment without significant acute effects, and was discharged to home in stable condition.   PLAN: Follow-up as scheduled for next fraction.

## 2014-11-13 NOTE — Progress Notes (Signed)
   Radiation Oncology (336) [phone] ________________________________  Stereotactic Treatment Procedure Note  Name: Kiara Mcdowell MurrayMRN: [id number] Date: 2/1/2016DOB: 1946-06-11  SPECIAL TREATMENT PROCEDURE    ICD-9-CM ICD-10-CM   1. Pituitary adenoma 227.3 D35.2      3D TREATMENT PLANNING AND DOSIMETRY: The patient's radiation plan was reviewed and approved by neurosurgery and radiation oncology prior to treatment. It showed 3-dimensional radiation distributions overlaid onto the planning CT/MRI image set. The [facility] for the target structures as well as the organs at risk were reviewed. The documentation of the 3D plan and dosimetry are filed in the radiation oncology EMR.  NARRATIVE: Coti Burd was brought to the TrueBeam stereotactic radiation treatment machine and placed supine on the CT couch. The head frame was applied, and the patient was set up for stereotactic radiosurgery. Neurosurgery was present for the set-up and delivery  SIMULATION VERIFICATION: In the couch zero-angle position, the patient underwent Exactrac imaging using the Brainlab system with orthogonal KV images. These were carefully aligned and repeated to confirm treatment position for each of the isocenters. The Exactrac snap film verification was repeated at each couch angle.  SPECIAL TREATMENT PROCEDURE: Claudius Sis received stereotactic radiosurgery to the following targets: Pituitary adenoma target was treated using 3 Rapid Arc VMAT Beams to a prescription dose of 5 Gy (plan is to receive a total of 25 Gy in 5 fractions). ExacTrac Snap verification was performed for each couch angle.   This constitutes a special treatment procedure due to the ablative dose delivered and the technical nature of treatment. This highly technical modality of treatment ensures that the ablative dose is centered on the patient's tumor  while sparing normal tissues from excessive dose and risk of detrimental effects.  STEREOTACTIC TREATMENT MANAGEMENT: Following delivery, the patient was transported to nursing in stable condition and monitored for possible acute effects. The patient tolerated treatment without significant acute effects, and was discharged to home in stable condition.   PLAN: Follow-up as scheduled for next fraction.

## 2014-11-15 ENCOUNTER — Encounter: Payer: Self-pay | Admitting: Radiation Oncology

## 2014-11-15 ENCOUNTER — Other Ambulatory Visit: Payer: Self-pay | Admitting: Radiation Therapy

## 2014-11-15 ENCOUNTER — Ambulatory Visit
Admission: RE | Admit: 2014-11-15 | Discharge: 2014-11-15 | Disposition: A | Discharge status: Home / Self Care | Payer: Medicare Other | Source: Ambulatory Visit | Attending: Radiation Oncology | Admitting: Radiation Oncology

## 2014-11-15 VITALS — BP 111/67 | HR 71 | Temp 97.7°F | Resp 16

## 2014-11-15 DIAGNOSIS — Z510 Encounter for antineoplastic radiation therapy: Secondary | ICD-10-CM | POA: Diagnosis not present

## 2014-11-15 DIAGNOSIS — D352 Benign neoplasm of pituitary gland: Secondary | ICD-10-CM

## 2014-11-15 NOTE — Progress Notes (Signed)
Patient denies pain, HA, nausea, dizziness, vision changes. VS stable. D/c home ambulatory, husband at her side.

## 2014-11-15 NOTE — Progress Notes (Signed)
  Radiation Oncology         [phone]) [phone] ________________________________  Stereotactic Treatment Procedure Note  Name: Brianna Hammond MRN: [id number]  Date: 11/15/2014  DOB: 04-16-1946  SPECIAL TREATMENT PROCEDURE  3D TREATMENT PLANNING AND DOSIMETRY:  The patient's radiation plan was reviewed and approved by neurosurgery and radiation oncology prior to treatment.  It showed 3-dimensional radiation distributions overlaid onto the planning CT/MRI image set.  The [facility] for the target structures as well as the organs at risk were reviewed. The documentation of the 3D plan and dosimetry are filed in the radiation oncology EMR.  NARRATIVE: Brianna Hammond was brought to the TrueBeam stereotactic radiation treatment machine and placed supine on the CT couch. The head frame was applied, and the patient was set up for stereotactic radiosurgery.  Neurosurgery was present for the set-up and delivery  SIMULATION VERIFICATION:  In the couch zero-angle position, the patient underwent Exactrac imaging using the Brainlab system with orthogonal KV images.  These were carefully aligned and repeated to confirm treatment position for each of the isocenters.  The Exactrac snap film verification was repeated at each couch angle.  SPECIAL TREATMENT PROCEDURE: Brianna Hammond received stereotactic radiosurgery to the following targets:    Pituitary adenoma target was treated using 3 Rapid Arc VMAT Beams to a prescription dose of 5 Gy (plan is to receive a total of 25 Gy in 5 fractions).  ExacTrac Snap verification was performed for each couch angle.   This constitutes a special treatment procedure due to the ablative dose delivered and the technical nature of treatment.  This highly technical modality of treatment ensures that the ablative dose is centered on the patient's tumor while sparing normal tissues from excessive dose and risk of detrimental effects.   STEREOTACTIC TREATMENT MANAGEMENT:   Following delivery, the patient was transported to nursing in stable condition and monitored for possible acute effects.  Vital signs were recorded BP 111/67 mmHg  Pulse 71  Temp(Src) 97.7 F (36.5 C) (Oral)  Resp 16  SpO2 100%. The patient tolerated treatment without significant acute effects, and was discharged to home in stable condition.    PLAN: Follow-up in one month.  ________________________________   Eppie Gibson, MD

## 2014-11-15 NOTE — Progress Notes (Signed)
S/p 5/5  SRS treatments completed, denies pain, nausea, dizziness, or vision changes, no pain, offered warm blanket and sprite , vitals wnl, monitor for 15 minutes, can cal for any unusual side effects if occurs after going home, patient did gave a headach  2 hours post SRS last week, took tylenol po prn before treatment today stated 1:23 PM

## 2014-11-20 NOTE — Progress Notes (Signed)
  Radiation Oncology         [phone]) [phone] ________________________________  Name: Greenly Rarick MRN: [id number]  Date: 11/15/2014  DOB: Oct 03, 1946  End of Treatment Note  Diagnosis:     ICD-9-CM ICD-10-CM   1. Pituitary adenoma 227.3 D35.2      Indication for treatment:  curative       Radiation treatment dates:   11/06/2014, 11/08/2014, 11/10/2014, 11/14/2014, 11/15/2014  Site/dose/Beams/energy:   Claudius Sis received stereotactic radiosurgery to the following target: Pituitary adenoma target was treated using 3 Rapid Arc VMAT Beams to a prescription dose of 5 Gy times 5 fractions to a total dose of 25 Gy. 6MV FFF photons were used.     Narrative: Ms. Garza  tolerated radiation treatment relatively well.     Plan: The patient has completed radiation treatment. The patient will return to radiation oncology clinic for routine followup in one month. I advised them to call or return sooner if they have any questions or concerns related to their recovery or treatment.  -----------------------------------  Eppie Gibson, MD

## 2014-12-06 NOTE — Progress Notes (Signed)
Patient ID: Esma Kilts, female   DOB: 03-18-1946, 69 y.o.   MRN: [id number]   Radiation Oncology         [phone]) [phone] ________________________________  Stereotactic Treatment Procedure Note Outpatient  Name: Allena Pietila MRN: [id number]  Date: 11/10/2014  DOB: 13-Dec-1945  SPECIAL TREATMENT PROCEDURE  Pituitary Adenoma D35.2  3D TREATMENT PLANNING AND DOSIMETRY:  The patient's radiation plan was reviewed and approved by neurosurgery and radiation oncology prior to treatment.  It showed 3-dimensional radiation distributions overlaid onto the planning CT/MRI image set.  The [facility] for the target structures as well as the organs at risk were reviewed. The documentation of the 3D plan and dosimetry are filed in the radiation oncology EMR.  NARRATIVE:  Dorette Hartel was brought to the TrueBeam stereotactic radiation treatment machine and placed supine on the CT couch. The head frame was applied, and the patient was set up for stereotactic radiosurgery.  Neurosurgery was present for the set-up and delivery  SIMULATION VERIFICATION:  In the couch zero-angle position, the patient underwent Exactrac imaging using the Brainlab system with orthogonal KV images.  These were carefully aligned and repeated to confirm treatment position for each of the isocenters.  The Exactrac snap film verification was repeated at each couch angle.  SPECIAL TREATMENT PROCEDURE: Claudius Sis received stereotactic radiosurgery to the following targets: Pituitary adenoma target was treated using 3 Rapid Arc VMAT Beams to a prescription dose of 5 Gy (plan is to receive a total of 25 Gy in 5 fractions).  ExacTrac Snap verification was performed for each couch angle.   This constitutes a special treatment procedure due to the ablative dose delivered and the technical nature of treatment.  This highly technical modality of treatment ensures that the ablative dose is centered on the patient's tumor  while sparing normal tissues from excessive dose and risk of detrimental effects.  STEREOTACTIC TREATMENT MANAGEMENT:  Following delivery, the patient was transported to nursing in stable condition and monitored for possible acute effects.  Vital signs were recorded . The patient tolerated treatment without significant acute effects, and was discharged to home in stable condition.    PLAN: Follow-up as scheduled for next fraction. ________________________________   Eppie Gibson, MD

## 2014-12-14 ENCOUNTER — Encounter: Payer: Self-pay | Admitting: *Deleted

## 2014-12-18 ENCOUNTER — Encounter: Payer: Self-pay | Admitting: Radiation Oncology

## 2014-12-18 ENCOUNTER — Ambulatory Visit
Admission: RE | Admit: 2014-12-18 | Discharge: 2014-12-18 | Disposition: A | Discharge status: Home / Self Care | Payer: Medicare Other | Source: Ambulatory Visit | Attending: Radiation Oncology | Admitting: Radiation Oncology

## 2014-12-18 VITALS — BP 120/69 | HR 75 | Temp 97.7°F | Resp 20 | Wt 117.0 lb

## 2014-12-18 DIAGNOSIS — D352 Benign neoplasm of pituitary gland: Secondary | ICD-10-CM

## 2014-12-18 NOTE — Progress Notes (Signed)
  Radiation Oncology         [phone]) [phone] ________________________________  Name: Brianna Hammond MRN: [id number]  Date: 12/18/2014  DOB: 06-04-1946  Follow-Up Visit Note  Outpatient  CC: Dion Body, MD  Jovita Gamma, MD  Diagnosis and Prior Radiotherapy:    ICD-9-CM ICD-10-CM   1. Pituitary adenoma 227.3 D35.2     Radiation treatment dates:   11/06/2014, 11/08/2014, 11/10/2014, 11/14/2014, 11/15/2014  Site/dose:  Pituitary Adenoma received prescription dose of 5 Gy times 5 fractions to a total dose of 25 Gy.    Narrative:  The patient returns today for routine follow-up.  Patient denies pain, HA, nausea, dizziness, vision changes, fatigue, loss of appetite. She reports HA twice during her treatments  She is not taking steroids. Vision stable.  Feels well.  ALLERGIES:  is allergic to decadron.  Meds: Current Outpatient Prescriptions  Medication Sig Dispense Refill  . Calcium-Magnesium-Vitamin D [id number] MG-MG-UNIT TB24 Take by mouth.    . Cholecalciferol 50000 UNITS capsule Take by mouth.    . Cyanocobalamin (VITAMIN B-12 CR) 1000 MCG TBCR Take by mouth.    . famotidine (PEPCID) 20 MG tablet Take 20 mg by mouth.    . levothyroxine (SYNTHROID, LEVOTHROID) 50 MCG tablet Take by mouth.    . potassium citrate (UROCIT-K) 10 MEQ (1080 MG) SR tablet Take 10 mEq by mouth daily.     . vitamin E 400 UNIT capsule Take by mouth.     No current facility-administered medications for this encounter.    Physical Findings: The patient is in no acute distress. Patient is alert and oriented.  weight is 117 lb (53.071 kg). Her temperature is 97.7 F (36.5 C). Her blood pressure is 120/69 and her pulse is 75. Her respiration is 20. Marland Kitchen   Peripheral vision intact.  Coordination, strength, gait, speech intact.  Lab Findings: No results found for: WBC, HGB, HCT, MCV, PLT  Radiographic Findings: No results found.  Impression/Plan: doing well.   I encouraged her to continue followup  with Dr Sherwood Gambler. I will see her back on an as-needed basis. Mont Dutton will verify when Dr Sherwood Gambler would like to see her back for imaging, endocrine labs, etc. We discussed the rationale of these tests. I have encouraged her to call if she has any issues or concerns in the future. She is pleased with this plan.I wished her the very best.   _____________________________________   Eppie Gibson, MD

## 2014-12-18 NOTE — Progress Notes (Addendum)
Patient denies pain, HA, nausea, dizziness, vision changes, fatigue, loss of appetite. She reports HA twice during her treatments, weakness and pain in thighs first 2 weeks following completion of treatments. She is not taking steroids. She states she woke up with mid back ache, but she attributes this to "sleeping in the same position all night". She applied OTC ointment and states she has full relief at this time.

## 2014-12-19 ENCOUNTER — Other Ambulatory Visit: Payer: Self-pay | Admitting: Radiation Therapy

## 2014-12-19 DIAGNOSIS — D352 Benign neoplasm of pituitary gland: Secondary | ICD-10-CM

## 2014-12-20 DIAGNOSIS — N270 Small kidney, unilateral: Secondary | ICD-10-CM

## 2014-12-20 HISTORY — DX: Small kidney, unilateral: N27.0

## 2015-05-07 ENCOUNTER — Other Ambulatory Visit: Payer: Medicare Other

## 2015-05-15 ENCOUNTER — Other Ambulatory Visit: Payer: Self-pay | Admitting: Radiation Therapy

## 2015-05-15 DIAGNOSIS — D352 Benign neoplasm of pituitary gland: Secondary | ICD-10-CM

## 2015-05-24 ENCOUNTER — Ambulatory Visit
Admission: RE | Admit: 2015-05-24 | Discharge: 2015-05-24 | Disposition: A | Discharge status: Home / Self Care | Payer: Medicare Other | Source: Ambulatory Visit | Attending: Radiation Oncology | Admitting: Radiation Oncology

## 2015-05-24 DIAGNOSIS — D352 Benign neoplasm of pituitary gland: Secondary | ICD-10-CM

## 2015-05-24 MED ORDER — GADOBENATE DIMEGLUMINE 529 MG/ML IV SOLN
10.0000 mL | Freq: Once | INTRAVENOUS | Status: AC | PRN
  Administered 2015-05-24: 10 mL via INTRAVENOUS

## 2016-03-11 ENCOUNTER — Other Ambulatory Visit: Payer: Self-pay | Admitting: Physician Assistant

## 2016-03-11 ENCOUNTER — Ambulatory Visit
Admission: RE | Admit: 2016-03-11 | Discharge: 2016-03-11 | Disposition: A | Discharge status: Home / Self Care | Payer: Medicare Other | Source: Ambulatory Visit | Attending: Physician Assistant | Admitting: Physician Assistant

## 2016-03-11 DIAGNOSIS — Q790 Congenital diaphragmatic hernia: Secondary | ICD-10-CM | POA: Diagnosis not present

## 2016-03-11 DIAGNOSIS — Z905 Acquired absence of kidney: Secondary | ICD-10-CM | POA: Insufficient documentation

## 2016-03-11 DIAGNOSIS — R1909 Other intra-abdominal and pelvic swelling, mass and lump: Secondary | ICD-10-CM | POA: Insufficient documentation

## 2016-03-11 DIAGNOSIS — R1903 Right lower quadrant abdominal swelling, mass and lump: Secondary | ICD-10-CM

## 2016-03-11 LAB — POCT I-STAT CREATININE: Creatinine, Ser: 1.1 mg/dL — ABNORMAL HIGH (ref 0.44–1.00)

## 2016-03-11 MED ORDER — IOPAMIDOL (ISOVUE-300) INJECTION 61%
85.0000 mL | Freq: Once | INTRAVENOUS | Status: AC | PRN
  Administered 2016-03-11: 85 mL via INTRAVENOUS

## 2016-03-25 ENCOUNTER — Other Ambulatory Visit: Payer: Self-pay | Admitting: Radiation Therapy

## 2016-03-25 DIAGNOSIS — D352 Benign neoplasm of pituitary gland: Secondary | ICD-10-CM

## 2016-04-04 ENCOUNTER — Encounter: Payer: Self-pay | Admitting: Surgery

## 2016-04-04 ENCOUNTER — Other Ambulatory Visit: Payer: Self-pay

## 2016-04-04 ENCOUNTER — Ambulatory Visit (INDEPENDENT_AMBULATORY_CARE_PROVIDER_SITE_OTHER): Payer: Medicare Other | Admitting: Surgery

## 2016-04-04 VITALS — BP 133/84 | HR 64 | Temp 98.1°F | Ht 60.0 in | Wt 131.0 lb

## 2016-04-04 DIAGNOSIS — E7800 Pure hypercholesterolemia, unspecified: Secondary | ICD-10-CM | POA: Insufficient documentation

## 2016-04-04 DIAGNOSIS — Z01818 Encounter for other preprocedural examination: Secondary | ICD-10-CM

## 2016-04-04 DIAGNOSIS — K449 Diaphragmatic hernia without obstruction or gangrene: Secondary | ICD-10-CM

## 2016-04-04 DIAGNOSIS — E039 Hypothyroidism, unspecified: Secondary | ICD-10-CM

## 2016-04-04 DIAGNOSIS — Q790 Congenital diaphragmatic hernia: Secondary | ICD-10-CM

## 2016-04-04 HISTORY — DX: Pure hypercholesterolemia, unspecified: E78.00

## 2016-04-04 HISTORY — DX: Hypothyroidism, unspecified: E03.9

## 2016-04-04 NOTE — Patient Instructions (Addendum)
We will arrange for your surgery to be done on 05/01/16. Please see your (blue) pre-care sheet provided.

## 2016-04-04 NOTE — Progress Notes (Signed)
Patient ID: Valera Preza, female   DOB: 15-Apr-1946, 70 y.o.   MRN: [id number]  History of Present Illness [name] is a 70 y.o. female easily diagnosed with the right inguinal hernia and. She reports that this first noticed a few weeks ago and she feels a lump. Part of the workup a CT scan was performed and there was evidence of a Morgagni hernia with a transverse colon in the right chest. She also had a small defect posteriorly and a second defect anteriorly. She only notices some constipation but no evidence of obstruction and no evidence of abdominal pain. She has had normal colonoscopy the last one being about 6 years ago. And she did have a history of pituitary tumor treated with excision and also gamma knife. And she also had a history of left kidney tumor and is status post left nephrectomy. Her creatinine is normal. She has good cardiovascular performance and is able to do more than 6 Mets of activity without any shortness of breath or chest pain CT personally reviewed  Past Medical History Past Medical History  Diagnosis Date  . Pituitary adenoma ([place]) 2005  . Kidney calculus   . Trigeminal herpes zoster   . TMJ syndrome   . Arthritis   . GERD (gastroesophageal reflux disease)   . Hepatitis A   . Hypothyroid   . Osteoporosis   . Fibrocystic breast disease   . History of radiation therapy 1/25, 1/27, 1/29, 2/2, 11/15/14    SRS pituitary adenoma 5 Gy in 5 fx, total 25 Gy      Past Surgical History  Procedure Laterality Date  . Tonsillectomy      age 40  . Breast biopsy Left 1986  . Nephrectomy radical Left 08/2003    for metanephric adenoma  . Transphenoidal pituitary resection  03/14/2004    infarcted pituitary tumor with features most suggestive of pituitary adenoma    Allergies  Allergen Reactions  . Decadron [Dexamethasone] Other (See Comments)    Flushing, hives    Current Outpatient Prescriptions  Medication Sig Dispense Refill  .  Calcium-Magnesium-Vitamin D [id number] MG-MG-UNIT TB24 Take by mouth.    . Cyanocobalamin (VITAMIN B-12 CR) 1000 MCG TBCR Take by mouth.    . famotidine (PEPCID) 20 MG tablet Take 20 mg by mouth.    . hydrocortisone (CORTEF) 5 MG tablet Take 1 tablet by mouth daily.    Marland Kitchen levothyroxine (SYNTHROID, LEVOTHROID) 50 MCG tablet Take by mouth.    . potassium citrate (UROCIT-K) 10 MEQ (1080 MG) SR tablet Take 10 mEq by mouth daily.     . Red Yeast Rice Extract 600 MG CAPS Take 1 tablet by mouth 1 day or 1 dose.    . vitamin E 400 UNIT capsule Take by mouth.     No current facility-administered medications for this visit.    Family History Family History  Problem Relation Age of Onset  . Cancer Mother     liver  . Lymphoma Mother      Social History Social History  Substance Use Topics  . Smoking status: Never Smoker   . Smokeless tobacco: None  . Alcohol Use: Yes     Comment: socially, rarely     ROS 10 pt ROS performed and is negative  Physical Exam Blood pressure 133/84, pulse 64, temperature 98.1 F (36.7 C), temperature source Oral, height 5' (1.524 m), weight 59.421 kg (131 lb).  CONSTITUTIONAL: NAD, well developed EYES: Pupils equal, round, and reactive  to light, Sclera non-icteric. EARS, NOSE, MOUTH AND THROAT: The oropharynx is clear. Oral mucosa is pink and moist. Hearing is intact to voice.  NECK: Trachea is midline, and there is no jugular venous distension. Thyroid is without palpable abnormalities. LYMPH NODES:  Lymph nodes in the neck are not enlarged. RESPIRATORY:  Lungs are clear, and breath sounds are equal bilaterally. Normal respiratory effort without pathologic use of accessory muscles. CARDIOVASCULAR: Heart is regular without murmurs, gallops, or rubs. GI: The abdomen is soft, nontender, and nondistended. There were no palpable masses. There was no hepatosplenomegaly. There were normal bowel sounds. There is A right Inguinal hernia, mildly tender, reducible.   MUSCULOSKELETAL:  Normal muscle strength and tone in all four extremities.    SKIN: Skin turgor is normal. There are no pathologic skin lesions.  NEUROLOGIC:  Motor and sensation is grossly normal.  Cranial nerves are grossly intact. PSYCH:  Alert and oriented to person, place and time. Affect is normal.  Data Reviewed I have personally reviewed the patient's imaging and medical records.    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 I have recommended repair given that the T colon is In her right chest. Discussed with her in detail about the procedure. We will attempt a laparoscopic repair with mesh placement. Discussed with the patient in detail about the risk, benefits and possible complications including but not limited to: Bleeding, recurrence of diaphragmatic injury, injury to adjacent structures, prolonged hospitalization. I do think that at some point We also need to perform the laparoscopic right inguinal hernia repair in a different setting.  Also discussed with our thoracic surgeon Dr. Faith Rogue and he will be assisting me in this case given the fact that her colon is in the right chest. Extensive counseling provided Caroleen Hamman, MD [place] 04/04/2016, 1:21 PM

## 2016-04-07 ENCOUNTER — Telehealth: Payer: Self-pay | Admitting: Surgery

## 2016-04-07 NOTE — Telephone Encounter (Signed)
Pt advised of pre op date/time and sx date. Sx: 05/01/16 with Dr Dahlia Byes, Dr Jetty Peeks Hernia repair.  Pre op: 04/22/16 @ 9:30am--Office.   Patient made aware to call [phone], between 1-3:00pm the day before surgery, to find out what time to arrive.

## 2016-04-22 ENCOUNTER — Encounter
Admission: RE | Admit: 2016-04-22 | Discharge: 2016-04-22 | Disposition: A | Discharge status: Home / Self Care | Payer: Medicare Other | Source: Ambulatory Visit | Attending: Surgery | Admitting: Surgery

## 2016-04-22 ENCOUNTER — Telehealth: Payer: Self-pay

## 2016-04-22 DIAGNOSIS — Z01812 Encounter for preprocedural laboratory examination: Secondary | ICD-10-CM | POA: Insufficient documentation

## 2016-04-22 DIAGNOSIS — Z01810 Encounter for preprocedural cardiovascular examination: Secondary | ICD-10-CM | POA: Insufficient documentation

## 2016-04-22 LAB — CBC
HCT: 38.6 % (ref 35.0–47.0)
HEMOGLOBIN: 13 g/dL (ref 12.0–16.0)
MCH: 28.3 pg (ref 26.0–34.0)
MCHC: 33.7 g/dL (ref 32.0–36.0)
MCV: 84.1 fL (ref 80.0–100.0)
Platelets: 165 10*3/uL (ref 150–440)
RBC: 4.6 MIL/uL (ref 3.80–5.20)
RDW: 13.5 % (ref 11.5–14.5)
WBC: 5 10*3/uL (ref 3.6–11.0)

## 2016-04-22 LAB — BASIC METABOLIC PANEL
ANION GAP: 5 (ref 5–15)
BUN: 19 mg/dL (ref 6–20)
CHLORIDE: 104 mmol/L (ref 101–111)
CO2: 29 mmol/L (ref 22–32)
Calcium: 9.3 mg/dL (ref 8.9–10.3)
Creatinine, Ser: 1.07 mg/dL — ABNORMAL HIGH (ref 0.44–1.00)
GFR calc non Af Amer: 52 mL/min — ABNORMAL LOW (ref >60)
GFR, EST AFRICAN AMERICAN: 60 mL/min — AB (ref >60)
Glucose, Bld: 88 mg/dL (ref 65–99)
Potassium: 4.1 mmol/L (ref 3.5–5.1)
Sodium: 138 mmol/L (ref 135–145)

## 2016-04-22 MED ORDER — POLYETHYLENE GLYCOL 3350 17 GM/SCOOP PO POWD: 1.0000 | Freq: Once | ORAL | Status: DC

## 2016-04-22 MED ORDER — BISACODYL 5 MG PO TBEC: 20.0000 mg | DELAYED_RELEASE_TABLET | Freq: Once | ORAL | Status: DC

## 2016-04-22 MED ORDER — ERYTHROMYCIN BASE 500 MG PO TABS: 1000.0000 mg | ORAL_TABLET | Freq: Three times a day (TID) | ORAL | Status: DC

## 2016-04-22 MED ORDER — NEOMYCIN SULFATE 500 MG PO TABS: 1000.0000 mg | ORAL_TABLET | Freq: Three times a day (TID) | ORAL | Status: DC

## 2016-04-22 NOTE — Telephone Encounter (Signed)
Patient's bowel prep sent to preferred pharmacy. Spoke with her over the phone to review instructions and placed bowel prep instructions in the mail at this time. Encouraged to call with any questions.

## 2016-04-22 NOTE — Telephone Encounter (Signed)
Patient was at Pre-admit today and bowel prep was placed on patient's orders for Pre-admission testing. Call was made to patient at this time.

## 2016-04-22 NOTE — Patient Instructions (Signed)
Your procedure is scheduled on: Thursday 05/01/16 Report to Day Surgery. 2ND FLOOR MEDICAL MALL ENTRANCE To find out your arrival time please call [phone] between 1PM - 3PM on Wednesday 04/30/16.  Remember: Instructions that are not followed completely may result in serious medical risk, up to and including death, or upon the discretion of your surgeon and anesthesiologist your surgery may need to be rescheduled.    __X__ 1. Do not eat food or drink liquids after midnight. No gum chewing or hard candies.     __X__ 2. No Alcohol for 24 hours before or after surgery.   ____ 3. Bring all medications with you on the day of surgery if instructed.    __X__ 4. Notify your doctor if there is any change in your medical condition     (cold, fever, infections).     Do not wear jewelry, make-up, hairpins, clips or nail polish.  Do not wear lotions, powders, or perfumes.   Do not shave 48 hours prior to surgery. Men may shave face and neck.  Do not bring valuables to the hospital.    [facility] is not responsible for any belongings or valuables.               Contacts, dentures or bridgework may not be worn into surgery.  Leave your suitcase in the car. After surgery it may be brought to your room.  For patients admitted to the hospital, discharge time is determined by your                treatment team.   Patients discharged the day of surgery will not be allowed to drive home.   Please read over the following fact sheets that you were given:   Surgical Site Infection Prevention   __X__ Take these medicines the morning of surgery with A SIP OF WATER:    1. PEPCID  2. LEVOTHYROXINE  3. HYDROCORTISONE  4.  5.  6.  ____ Fleet Enema (as directed)   __X__ Use CHG Soap as directed  ____ Use inhalers on the day of surgery  ____ Stop metformin 2 days prior to surgery    ____ Take 1/2 of usual insulin dose the night before surgery and none on the morning of surgery.   ____ Stop  Coumadin/Plavix/aspirin on   ____ Stop Anti-inflammatories on    __X__ Stop supplements until after surgery. B12, RED YEAST RICE   ____ Bring C-Pap to the hospital.

## 2016-04-26 NOTE — Pre-Procedure Instructions (Signed)
EKG sent to Anesthesia for review. 

## 2016-04-28 ENCOUNTER — Telehealth: Payer: Self-pay | Admitting: Surgery

## 2016-04-28 NOTE — Telephone Encounter (Signed)
Pre admit has sent a fax over requesting Medical clearance for Abnormal EKG. Patient's date of surgery is schedule for 05/01/16 with Dr Tacy Learn Laparoscopic hiatal hernia repair. Judeen Hammans called from Pre admit and stated that she has faxed a request for clearance to Dr Raylene Miyamoto office at [phone].

## 2016-04-28 NOTE — Telephone Encounter (Signed)
Patient is having repair of hiatal hernia on 7/20 with Dr Dahlia Byes. She has 2 antibiotics and would like to know when she should start taking them.

## 2016-04-28 NOTE — Telephone Encounter (Signed)
Noted. Will await clearance form

## 2016-04-28 NOTE — Pre-Procedure Instructions (Addendum)
MEDICAL CLEARANCE REQUEST/EKG,AS INSTRUCTED BY DR J ADAMS CALLED AND FAXED TO AMY AT DR PABON'S. ALSO CALLED AND FAXED TO DR Netty Starring AND SPOKE WITH DAWN. ALSO NOTIFIED PATIENT

## 2016-04-30 NOTE — Pre-Procedure Instructions (Signed)
CLEARED LOW RISK BY DR Netty Starring AND REPEAT EKG DONE

## 2016-04-30 NOTE — Telephone Encounter (Signed)
Clearance has been obtained per Anesthesia.

## 2016-05-01 ENCOUNTER — Encounter: Payer: Self-pay | Admitting: *Deleted

## 2016-05-01 ENCOUNTER — Encounter
Admission: RE | Disposition: A | Discharge status: Home / Self Care | Payer: Self-pay | Source: Ambulatory Visit | Attending: Surgery

## 2016-05-01 ENCOUNTER — Observation Stay
Admission: RE | Admit: 2016-05-01 | Discharge: 2016-05-03 | Disposition: A | Discharge status: Home / Self Care | Payer: Medicare Other | Source: Ambulatory Visit | Attending: Surgery | Admitting: Surgery

## 2016-05-01 ENCOUNTER — Inpatient Hospital Stay: Payer: Medicare Other | Admitting: Certified Registered"

## 2016-05-01 DIAGNOSIS — M26609 Unspecified temporomandibular joint disorder, unspecified side: Secondary | ICD-10-CM | POA: Insufficient documentation

## 2016-05-01 DIAGNOSIS — K449 Diaphragmatic hernia without obstruction or gangrene: Principal | ICD-10-CM

## 2016-05-01 DIAGNOSIS — Z905 Acquired absence of kidney: Secondary | ICD-10-CM | POA: Insufficient documentation

## 2016-05-01 DIAGNOSIS — M1990 Unspecified osteoarthritis, unspecified site: Secondary | ICD-10-CM | POA: Insufficient documentation

## 2016-05-01 DIAGNOSIS — Z8639 Personal history of other endocrine, nutritional and metabolic disease: Secondary | ICD-10-CM | POA: Diagnosis not present

## 2016-05-01 DIAGNOSIS — M810 Age-related osteoporosis without current pathological fracture: Secondary | ICD-10-CM | POA: Diagnosis not present

## 2016-05-01 DIAGNOSIS — K219 Gastro-esophageal reflux disease without esophagitis: Secondary | ICD-10-CM | POA: Diagnosis not present

## 2016-05-01 DIAGNOSIS — Z807 Family history of other malignant neoplasms of lymphoid, hematopoietic and related tissues: Secondary | ICD-10-CM | POA: Diagnosis not present

## 2016-05-01 DIAGNOSIS — Z79899 Other long term (current) drug therapy: Secondary | ICD-10-CM | POA: Insufficient documentation

## 2016-05-01 DIAGNOSIS — Z87442 Personal history of urinary calculi: Secondary | ICD-10-CM | POA: Insufficient documentation

## 2016-05-01 DIAGNOSIS — Z888 Allergy status to other drugs, medicaments and biological substances status: Secondary | ICD-10-CM | POA: Insufficient documentation

## 2016-05-01 DIAGNOSIS — E039 Hypothyroidism, unspecified: Secondary | ICD-10-CM | POA: Insufficient documentation

## 2016-05-01 DIAGNOSIS — K660 Peritoneal adhesions (postprocedural) (postinfection): Secondary | ICD-10-CM | POA: Insufficient documentation

## 2016-05-01 DIAGNOSIS — N6019 Diffuse cystic mastopathy of unspecified breast: Secondary | ICD-10-CM | POA: Insufficient documentation

## 2016-05-01 DIAGNOSIS — Z800 Family history of malignant neoplasm of digestive organs: Secondary | ICD-10-CM | POA: Insufficient documentation

## 2016-05-01 DIAGNOSIS — Z923 Personal history of irradiation: Secondary | ICD-10-CM | POA: Insufficient documentation

## 2016-05-01 DIAGNOSIS — Q790 Congenital diaphragmatic hernia: Secondary | ICD-10-CM | POA: Insufficient documentation

## 2016-05-01 HISTORY — PX: HIATAL HERNIA REPAIR: SHX195

## 2016-05-01 LAB — CBC
HCT: 34.1 % — ABNORMAL LOW (ref 35.0–47.0)
Hemoglobin: 11.7 g/dL — ABNORMAL LOW (ref 12.0–16.0)
MCH: 29 pg (ref 26.0–34.0)
MCHC: 34.4 g/dL (ref 32.0–36.0)
MCV: 84.2 fL (ref 80.0–100.0)
PLATELETS: 140 10*3/uL — AB (ref 150–440)
RBC: 4.05 MIL/uL (ref 3.80–5.20)
RDW: 13.2 % (ref 11.5–14.5)
WBC: 7.7 10*3/uL (ref 3.6–11.0)

## 2016-05-01 LAB — CREATININE, SERUM
CREATININE: 1.08 mg/dL — AB (ref 0.44–1.00)
GFR calc Af Amer: 59 mL/min — ABNORMAL LOW (ref >60)
GFR calc non Af Amer: 51 mL/min — ABNORMAL LOW (ref >60)

## 2016-05-01 SURGERY — REPAIR, HERNIA, HIATAL, LAPAROSCOPIC
Anesthesia: General | Wound class: Clean

## 2016-05-01 MED ORDER — ACETAMINOPHEN 10 MG/ML IV SOLN
INTRAVENOUS | Status: DC | PRN
  Administered 2016-05-01: 1000 mg via INTRAVENOUS

## 2016-05-01 MED ORDER — CEFAZOLIN SODIUM-DEXTROSE 2-4 GM/100ML-% IV SOLN
INTRAVENOUS | Status: AC
  Administered 2016-05-01: 2 g via INTRAVENOUS
  Filled 2016-05-01: qty 100

## 2016-05-01 MED ORDER — FAMOTIDINE IN NACL 20-0.9 MG/50ML-% IV SOLN
20.0000 mg | Freq: Two times a day (BID) | INTRAVENOUS | Status: DC
  Administered 2016-05-01 – 2016-05-02 (×4): 20 mg via INTRAVENOUS
  Filled 2016-05-01 (×6): qty 50

## 2016-05-01 MED ORDER — LACTATED RINGERS IV SOLN
INTRAVENOUS | Status: DC
  Administered 2016-05-01 (×2): via INTRAVENOUS

## 2016-05-01 MED ORDER — HYDROMORPHONE HCL 1 MG/ML IJ SOLN
INTRAMUSCULAR | Status: AC
  Filled 2016-05-01: qty 1

## 2016-05-01 MED ORDER — LIDOCAINE HCL (PF) 1 % IJ SOLN
INTRAMUSCULAR | Status: AC
  Filled 2016-05-01: qty 30

## 2016-05-01 MED ORDER — ONDANSETRON HCL 4 MG/2ML IJ SOLN
INTRAMUSCULAR | Status: DC | PRN
  Administered 2016-05-01: 4 mg via INTRAVENOUS

## 2016-05-01 MED ORDER — NALOXONE HCL 2 MG/2ML IJ SOSY
PREFILLED_SYRINGE | INTRAMUSCULAR | Status: AC
  Filled 2016-05-01: qty 2

## 2016-05-01 MED ORDER — BUPIVACAINE-EPINEPHRINE (PF) 0.25% -1:200000 IJ SOLN
INTRAMUSCULAR | Status: AC
Start: 2016-05-01 — End: 2016-05-01
  Filled 2016-05-01: qty 30

## 2016-05-01 MED ORDER — NEOSTIGMINE METHYLSULFATE 10 MG/10ML IV SOLN
INTRAVENOUS | Status: DC | PRN
  Administered 2016-05-01: 4 mg via INTRAVENOUS

## 2016-05-01 MED ORDER — HEPARIN SODIUM (PORCINE) 5000 UNIT/ML IJ SOLN
5000.0000 [IU] | Freq: Once | INTRAMUSCULAR | Status: AC
  Administered 2016-05-01: 5000 [IU] via SUBCUTANEOUS

## 2016-05-01 MED ORDER — LEVOTHYROXINE SODIUM 50 MCG PO TABS
50.0000 ug | ORAL_TABLET | Freq: Every day | ORAL | Status: DC
  Administered 2016-05-02 – 2016-05-03 (×2): 50 ug via ORAL
  Filled 2016-05-01 (×2): qty 1

## 2016-05-01 MED ORDER — OXYCODONE HCL 5 MG/5ML PO SOLN: 5.0000 mg | Freq: Once | ORAL | Status: DC | PRN

## 2016-05-01 MED ORDER — LACTATED RINGERS IV SOLN
INTRAVENOUS | Status: DC
  Administered 2016-05-01: 07:00:00 via INTRAVENOUS

## 2016-05-01 MED ORDER — ACETAMINOPHEN 500 MG PO TABS
1000.0000 mg | ORAL_TABLET | Freq: Four times a day (QID) | ORAL | Status: DC
  Administered 2016-05-01 – 2016-05-03 (×7): 1000 mg via ORAL
  Filled 2016-05-01 (×8): qty 2

## 2016-05-01 MED ORDER — FENTANYL CITRATE (PF) 100 MCG/2ML IJ SOLN
INTRAMUSCULAR | Status: AC
  Administered 2016-05-01: 50 ug
  Filled 2016-05-01: qty 2

## 2016-05-01 MED ORDER — LIDOCAINE HCL (PF) 1 % IJ SOLN
INTRAMUSCULAR | Status: DC | PRN
  Administered 2016-05-01: 20 mL

## 2016-05-01 MED ORDER — LIDOCAINE HCL (CARDIAC) 20 MG/ML IV SOLN
INTRAVENOUS | Status: DC | PRN
  Administered 2016-05-01: 60 mg via INTRAVENOUS

## 2016-05-01 MED ORDER — HYDROMORPHONE HCL 1 MG/ML IJ SOLN
0.2500 mg | INTRAMUSCULAR | Status: DC | PRN
  Administered 2016-05-01 (×2): 0.5 mg via INTRAVENOUS

## 2016-05-01 MED ORDER — CEFAZOLIN SODIUM-DEXTROSE 2-4 GM/100ML-% IV SOLN
2.0000 g | INTRAVENOUS | Status: AC
  Administered 2016-05-01: 2 g via INTRAVENOUS

## 2016-05-01 MED ORDER — BUPIVACAINE-EPINEPHRINE (PF) 0.25% -1:200000 IJ SOLN
INTRAMUSCULAR | Status: DC | PRN
  Administered 2016-05-01: 20 mL via PERINEURAL

## 2016-05-01 MED ORDER — NALOXONE HCL 2 MG/2ML IJ SOSY: 1.0000 mg | PREFILLED_SYRINGE | Freq: Once | INTRAMUSCULAR | Status: DC

## 2016-05-01 MED ORDER — PROPOFOL 10 MG/ML IV BOLUS
INTRAVENOUS | Status: DC | PRN
  Administered 2016-05-01: 100 mg via INTRAVENOUS

## 2016-05-01 MED ORDER — HEPARIN SODIUM (PORCINE) 5000 UNIT/ML IJ SOLN
INTRAMUSCULAR | Status: AC
  Administered 2016-05-01: 5000 [IU] via SUBCUTANEOUS
  Filled 2016-05-01: qty 1

## 2016-05-01 MED ORDER — OXYCODONE HCL 5 MG PO TABS: 5.0000 mg | ORAL_TABLET | ORAL | Status: DC | PRN

## 2016-05-01 MED ORDER — ENOXAPARIN SODIUM 40 MG/0.4ML ~~LOC~~ SOLN
40.0000 mg | SUBCUTANEOUS | Status: DC
  Administered 2016-05-02: 40 mg via SUBCUTANEOUS
  Filled 2016-05-01: qty 0.4

## 2016-05-01 MED ORDER — LORAZEPAM 2 MG/ML IJ SOLN
0.5000 mg | Freq: Once | INTRAMUSCULAR | Status: AC
  Administered 2016-05-01: 0.5 mg via INTRAVENOUS

## 2016-05-01 MED ORDER — PHENYLEPHRINE HCL 10 MG/ML IJ SOLN
INTRAMUSCULAR | Status: DC | PRN
  Administered 2016-05-01 (×6): 100 ug via INTRAVENOUS

## 2016-05-01 MED ORDER — ACETAMINOPHEN 10 MG/ML IV SOLN
INTRAVENOUS | Status: AC
  Filled 2016-05-01: qty 100

## 2016-05-01 MED ORDER — CHLORHEXIDINE GLUCONATE CLOTH 2 % EX PADS: 6.0000 | MEDICATED_PAD | Freq: Once | CUTANEOUS | Status: DC

## 2016-05-01 MED ORDER — GLYCOPYRROLATE 0.2 MG/ML IJ SOLN
INTRAMUSCULAR | Status: DC | PRN
  Administered 2016-05-01: .6 mg via INTRAVENOUS

## 2016-05-01 MED ORDER — EVICEL 5 ML EX KIT
PACK | CUTANEOUS | Status: DC | PRN
  Administered 2016-05-01: 5 mL

## 2016-05-01 MED ORDER — MEPERIDINE HCL 25 MG/ML IJ SOLN: 6.2500 mg | INTRAMUSCULAR | Status: DC | PRN

## 2016-05-01 MED ORDER — FENTANYL CITRATE (PF) 100 MCG/2ML IJ SOLN
INTRAMUSCULAR | Status: AC
  Administered 2016-05-01 (×2): 25 ug
  Filled 2016-05-01: qty 2

## 2016-05-01 MED ORDER — LORAZEPAM 2 MG/ML IJ SOLN
INTRAMUSCULAR | Status: AC
  Filled 2016-05-01: qty 1

## 2016-05-01 MED ORDER — ONDANSETRON HCL 4 MG/2ML IJ SOLN: 4.0000 mg | Freq: Four times a day (QID) | INTRAMUSCULAR | Status: DC | PRN

## 2016-05-01 MED ORDER — PROMETHAZINE HCL 25 MG/ML IJ SOLN: 6.2500 mg | INTRAMUSCULAR | Status: DC | PRN

## 2016-05-01 MED ORDER — ROCURONIUM BROMIDE 100 MG/10ML IV SOLN
INTRAVENOUS | Status: DC | PRN
  Administered 2016-05-01 (×3): 5 mg via INTRAVENOUS
  Administered 2016-05-01: 40 mg via INTRAVENOUS

## 2016-05-01 MED ORDER — KETOROLAC TROMETHAMINE 30 MG/ML IJ SOLN
INTRAMUSCULAR | Status: DC | PRN
  Administered 2016-05-01: 15 mg via INTRAVENOUS

## 2016-05-01 MED ORDER — EVICEL 5 ML EX KIT
PACK | CUTANEOUS | Status: AC
  Filled 2016-05-01: qty 1

## 2016-05-01 MED ORDER — OXYCODONE HCL 5 MG PO TABS: 5.0000 mg | ORAL_TABLET | Freq: Once | ORAL | Status: DC | PRN

## 2016-05-01 MED ORDER — FENTANYL CITRATE (PF) 100 MCG/2ML IJ SOLN
INTRAMUSCULAR | Status: DC | PRN
  Administered 2016-05-01: 25 ug via INTRAVENOUS
  Administered 2016-05-01: 50 ug via INTRAVENOUS
  Administered 2016-05-01: 100 ug via INTRAVENOUS
  Administered 2016-05-01: 25 ug via INTRAVENOUS
  Administered 2016-05-01: 50 ug via INTRAVENOUS

## 2016-05-01 MED ORDER — FENTANYL CITRATE (PF) 100 MCG/2ML IJ SOLN
25.0000 ug | INTRAMUSCULAR | Status: AC | PRN
  Administered 2016-05-01 (×6): 25 ug via INTRAVENOUS

## 2016-05-01 MED ORDER — MIDAZOLAM HCL 2 MG/2ML IJ SOLN
INTRAMUSCULAR | Status: DC | PRN
  Administered 2016-05-01 (×2): 1 mg via INTRAVENOUS

## 2016-05-01 MED ORDER — ONDANSETRON 8 MG PO TBDP: 4.0000 mg | ORAL_TABLET | Freq: Four times a day (QID) | ORAL | Status: DC | PRN

## 2016-05-01 MED ORDER — MORPHINE SULFATE (PF) 2 MG/ML IV SOLN: 2.0000 mg | INTRAVENOUS | Status: DC | PRN

## 2016-05-01 SURGICAL SUPPLY — 52 items
BLADE SURG SZ11 CARB STEEL (BLADE) ×1 IMPLANT
CANISTER SUCT 1200ML W/VALVE (MISCELLANEOUS) ×2 IMPLANT
CHLORAPREP W/TINT 26ML (MISCELLANEOUS) ×2 IMPLANT
DEFOGGER SCOPE WARMER CLEARIFY (MISCELLANEOUS) ×2 IMPLANT
DEVICE SECURE STRAP 25 ABSORB (INSTRUMENTS) ×2 IMPLANT
DEVICE SUTURE ENDOST 10MM (ENDOMECHANICALS) ×1 IMPLANT
DEVICE TROCAR PUNCTURE CLOSURE (ENDOMECHANICALS) ×2 IMPLANT
DRAPE INCISE IOBAN 66X45 STRL (DRAPES) ×2 IMPLANT
DRAPE LEGGINS SURG 28X43 STRL (DRAPES) ×1 IMPLANT
DRAPE UNDER BUTTOCK W/FLU (DRAPES) ×1 IMPLANT
ELECT CAUTERY BLADE 6.4 (BLADE) ×2 IMPLANT
GLOVE BIOGEL M 7.0 STRL (GLOVE) ×1 IMPLANT
GOWN STRL REUS W/ TWL LRG LVL3 (GOWN DISPOSABLE) ×3 IMPLANT
GOWN STRL REUS W/TWL LRG LVL3 (GOWN DISPOSABLE) ×8
GRASPER SUT TROCAR 14GX15 (MISCELLANEOUS) ×1 IMPLANT
HEMOSTAT SURGICEL 2X3 (HEMOSTASIS) ×1 IMPLANT
IRRIGATION STRYKERFLOW (MISCELLANEOUS) ×1 IMPLANT
IRRIGATOR STRYKERFLOW (MISCELLANEOUS) ×2
IV NS 1000ML (IV SOLUTION) ×2
IV NS 1000ML BAXH (IV SOLUTION) ×1 IMPLANT
KIT PINK PAD W/HEAD ARE REST (MISCELLANEOUS) ×2
KIT PINK PAD W/HEAD ARM REST (MISCELLANEOUS) IMPLANT
L-HOOK LAP DISP 36CM (ELECTROSURGICAL) ×2
LABEL OR SOLS (LABEL) ×2 IMPLANT
LHOOK LAP DISP 36CM (ELECTROSURGICAL) ×1 IMPLANT
LIQUID BAND (GAUZE/BANDAGES/DRESSINGS) ×2 IMPLANT
MESH VENTRALIGHT ST 4X6IN (Mesh General) ×1 IMPLANT
NDL SAFETY 22GX1.5 (NEEDLE) ×2 IMPLANT
NS IRRIG 500ML POUR BTL (IV SOLUTION) ×2 IMPLANT
PACK LAP CHOLECYSTECTOMY (MISCELLANEOUS) ×2 IMPLANT
PAD GROUND ADULT SPLIT (MISCELLANEOUS) ×2 IMPLANT
PENCIL ELECTRO HAND CTR (MISCELLANEOUS) ×2 IMPLANT
SCISSORS METZENBAUM CVD 33 (INSTRUMENTS) ×2 IMPLANT
SHEARS HARMONIC ACE PLUS 36CM (ENDOMECHANICALS) ×2 IMPLANT
SLEEVE ENDOPATH XCEL 5M (ENDOMECHANICALS) ×4 IMPLANT
SLEEVE PROTECTION STRL DISP (MISCELLANEOUS) ×1 IMPLANT
SUT ETHIBOND 0 36 GRN (SUTURE) ×4 IMPLANT
SUT ETHIBOND CT1 BRD #0 30IN (SUTURE) ×1 IMPLANT
SUT MNCRL 4-0 (SUTURE) ×2
SUT MNCRL 4-0 27XMFL (SUTURE) ×1
SUT VIC AB 3-0 SH 27 (SUTURE) ×2
SUT VIC AB 3-0 SH 27X BRD (SUTURE) ×1 IMPLANT
SUT VICRYL 0 AB UR-6 (SUTURE) ×5 IMPLANT
SUTURE MNCRL 4-0 27XMF (SUTURE) ×1 IMPLANT
TIP RIGID 35CM EVICEL (HEMOSTASIS) ×1 IMPLANT
TRAY FOLEY CATH SILVER 16FR LF (SET/KITS/TRAYS/PACK) ×1 IMPLANT
TROCAR 12M 150ML BLUNT (TROCAR) ×1 IMPLANT
TROCAR XCEL BLUNT TIP 100MML (ENDOMECHANICALS) ×1 IMPLANT
TROCAR XCEL NON-BLD 11X100MML (ENDOMECHANICALS) ×2 IMPLANT
TROCAR XCEL NON-BLD 5MMX100MML (ENDOMECHANICALS) ×2 IMPLANT
TROCAR XCEL UNIV SLVE 11M 100M (ENDOMECHANICALS) ×2 IMPLANT
TUBING INSUFFLATOR HEATED (MISCELLANEOUS) ×1 IMPLANT

## 2016-05-01 NOTE — Op Note (Signed)
  Pre-operative Diagnosis: Morgagni hernia  Post-operative Diagnosis: same  Procedure:  Laparoscopic enterolysis Laparoscopic repair of Morgagni Hernia with Ultralight Mesh 10x 15 cms  Surgeon: Caroleen Hamman, MD FACS  Anesthesia: Gen. with endotracheal tube  Assistant: Nestor Lewandowsky, MD   Findings: 6.5 cm Morgagni hernia  Estimated Blood Loss: 25cc         Specimens: None           Complications: none   Procedure Details  The patient was seen again in the Holding Room. The benefits, complications, treatment options, and expected outcomes were discussed with the patient. The risks of bleeding, infection, recurrence of symptoms, failure to resolve symptoms,  bowel injury,  were reviewed with the patient. The likelihood of improving the patient's symptoms with return to their baseline status is good.  The patient and/or family concurred with the proposed plan, giving informed consent.  The patient was taken to Operating Room A Time Out was held.  Prior to the induction of general anesthesia, antibiotic prophylaxis was administered. VTE prophylaxis was in place. General endotracheal anesthesia was then administered and tolerated well. After the induction, the abdomen was prepped with Chloraprep and draped in the sterile fashion. The patient was positioned in the lithotomy position.  Incision was created to the right of the  Subcostal area, Cut down technique was used to enter the abdominal cavity and a Hasson trochar was placed after two vicryl stitches were anchored to the fascia. Pneumoperitoneum was then created with CO2 and tolerated well without any adverse changes in the patient's vital signs.  Four 5-mm ports were placed under direct visualization. The Morgagni hernia was visualized, no clear evidence of Bochdalek hernia was encountered. Colon was grasped and there were some thick adhesions band holding the colon to the hernia sac, there were lysed w the harmonic scalpel in the  standard fashion. Enterolysis took about 15 minutes. The colon was reduced to the abdominal cavity. The defect was measured 6.5 cms. Trans fascial sutures were used to closed the defect primarily using the suture passer with multiple 0 ethibond sutures. We had very nice closure of the defect and the sutures were tied down. An ultralight mesh 10x15 cms was used and anchor w a trans fascial sutures in the center. Using a secure strap device the mesh was secured. On the left side we avoided the pericardial area to prevent injury to the heart or great vessels.  Evaseal was used to glue the mesh on the left side. There was a very nice coverage of the defect and I was very pleased with the result. Surgicel was used to obtain hemostasis in one of the site of the mesh. No significant bleeding encountered. Pneumoperitoneum deflated and all ports removed under direct visualization. The 12 mm port closed in a two layer fashion with 0 Vicryl sutures. 4-0 subcuticular Monocryl was used to close the skin. Dermabond was  applied.  The patient was then extubated and brought to the recovery room in stable condition. Sponge, lap, and needle counts were correct at closure and at the conclusion of the case.               Caroleen Hamman, MD, FACS

## 2016-05-01 NOTE — Anesthesia Postprocedure Evaluation (Addendum)
Anesthesia Post Note  Patient: Brianna Hammond  Procedure(s) Performed: Procedure(s) (LRB): LAPAROSCOPIC REPAIR OF MORGAGNI HERNIA (N/A)  Patient location during evaluation: PACU Anesthesia Type: General Level of consciousness: awake and alert and oriented Pain management: pain level controlled Vital Signs Assessment: post-procedure vital signs reviewed and stable Respiratory status: spontaneous breathing, nonlabored ventilation and respiratory function stable Cardiovascular status: blood pressure returned to baseline and stable Postop Assessment: no signs of nausea or vomiting Anesthetic complications: no    Last Vitals:  Filed Vitals:   05/01/16 1100 05/01/16 1105  BP: 111/57   Pulse: 69 79  Temp:  36.1 C  Resp: 15 14    Last Pain:  Filed Vitals:   05/01/16 1106  PainSc: 2                  Called to PACU at 1128, on arrival pt non responsive and desat and nurses providing bag mask ventilation, patient had most recently received dilaudid, I took over bag mask ventilation and sats improved, pulses maintained throughout, narcan given and patient responding with appropriate respiratory rate and 100% O2 sat on RA. Plan to continue to treat pain with only short acting agents  Brianna Hammond

## 2016-05-01 NOTE — Anesthesia Preprocedure Evaluation (Signed)
Anesthesia Evaluation  Patient identified by MRN, date of birth, ID band Patient awake    Reviewed: Allergy & Precautions, NPO status , Patient's Chart, lab work & pertinent test results  History of Anesthesia Complications Negative for: history of anesthetic complications  Airway Mallampati: II  TM Distance: >3 FB Neck ROM: Full    Dental no notable dental hx.    Pulmonary neg sleep apnea, neg COPD,    breath sounds clear to auscultation- rhonchi (-) wheezing      Cardiovascular Exercise Tolerance: Good (-) hypertension(-) CAD and (-) Past MI  Rhythm:Regular Rate:Normal - Systolic murmurs and - Diastolic murmurs    Neuro/Psych Hx of pituitary tumor s/p resection and radiation therapy     GI/Hepatic Neg liver ROS, hiatal hernia, GERD  Controlled,  Endo/Other  neg diabetesHypothyroidism   Renal/GU Renal disease (hx of renal tumor s/p nephrectomy)     Musculoskeletal  (+) Arthritis , Osteoarthritis,    Abdominal (+) - obese,   Peds  Hematology negative hematology ROS (+)   Anesthesia Other Findings Past Medical History:   Pituitary adenoma ([place])                         2005         Kidney calculus                                              Trigeminal herpes zoster                                     TMJ syndrome                                                 Arthritis                                                    GERD (gastroesophageal reflux disease)                       Hepatitis A                                                  Hypothyroid                                                  Osteoporosis                                                 Fibrocystic breast disease  History of radiation therapy                    1/25, 1/2*     Comment:SRS pituitary adenoma 5 Gy in 5 fx, total 25 Gy   Reproductive/Obstetrics negative OB ROS                             Anesthesia Physical Anesthesia Plan  ASA: II  Anesthesia Plan: General   Post-op Pain Management:    Induction: Intravenous  Airway Management Planned: Oral ETT  Additional Equipment:   Intra-op Plan:   Post-operative Plan: Extubation in OR  Informed Consent: I have reviewed the patients History and Physical, chart, labs and discussed the procedure including the risks, benefits and alternatives for the proposed anesthesia with the patient or authorized representative who has indicated his/her understanding and acceptance.   Dental advisory given  Plan Discussed with:   Anesthesia Plan Comments:         Anesthesia Quick Evaluation

## 2016-05-01 NOTE — H&P (View-Only) (Signed)
Patient ID: Brianna Hammond, female   DOB: 15-Apr-1946, 70 y.o.   MRN: [id number]  History of Present Illness Brianna Hammond is a 70 y.o. female easily diagnosed with the right inguinal hernia and. She reports that this first noticed a few weeks ago and she feels a lump. Part of the workup a CT scan was performed and there was evidence of a Morgagni hernia with a transverse colon in the right chest. She also had a small defect posteriorly and a second defect anteriorly. She only notices some constipation but no evidence of obstruction and no evidence of abdominal pain. She has had normal colonoscopy the last one being about 6 years ago. And she did have a history of pituitary tumor treated with excision and also gamma knife. And she also had a history of left kidney tumor and is status post left nephrectomy. Her creatinine is normal. She has good cardiovascular performance and is able to do more than 6 Mets of activity without any shortness of breath or chest pain CT personally reviewed  Past Medical History Past Medical History  Diagnosis Date  . Pituitary adenoma ([place]) 2005  . Kidney calculus   . Trigeminal herpes zoster   . TMJ syndrome   . Arthritis   . GERD (gastroesophageal reflux disease)   . Hepatitis A   . Hypothyroid   . Osteoporosis   . Fibrocystic breast disease   . History of radiation therapy 1/25, 1/27, 1/29, 2/2, 11/15/14    SRS pituitary adenoma 5 Gy in 5 fx, total 25 Gy      Past Surgical History  Procedure Laterality Date  . Tonsillectomy      age 40  . Breast biopsy Left 1986  . Nephrectomy radical Left 08/2003    for metanephric adenoma  . Transphenoidal pituitary resection  03/14/2004    infarcted pituitary tumor with features most suggestive of pituitary adenoma    Allergies  Allergen Reactions  . Decadron [Dexamethasone] Other (See Comments)    Flushing, hives    Current Outpatient Prescriptions  Medication Sig Dispense Refill  .  Calcium-Magnesium-Vitamin D [id number] MG-MG-UNIT TB24 Take by mouth.    . Cyanocobalamin (VITAMIN B-12 CR) 1000 MCG TBCR Take by mouth.    . famotidine (PEPCID) 20 MG tablet Take 20 mg by mouth.    . hydrocortisone (CORTEF) 5 MG tablet Take 1 tablet by mouth daily.    Marland Kitchen levothyroxine (SYNTHROID, LEVOTHROID) 50 MCG tablet Take by mouth.    . potassium citrate (UROCIT-K) 10 MEQ (1080 MG) SR tablet Take 10 mEq by mouth daily.     . Red Yeast Rice Extract 600 MG CAPS Take 1 tablet by mouth 1 day or 1 dose.    . vitamin E 400 UNIT capsule Take by mouth.     No current facility-administered medications for this visit.    Family History Family History  Problem Relation Age of Onset  . Cancer Mother     liver  . Lymphoma Mother      Social History Social History  Substance Use Topics  . Smoking status: Never Smoker   . Smokeless tobacco: None  . Alcohol Use: Yes     Comment: socially, rarely     ROS 10 pt ROS performed and is negative  Physical Exam Blood pressure 133/84, pulse 64, temperature 98.1 F (36.7 C), temperature source Oral, height 5' (1.524 m), weight 59.421 kg (131 lb).  CONSTITUTIONAL: NAD, well developed EYES: Pupils equal, round, and reactive  to light, Sclera non-icteric. EARS, NOSE, MOUTH AND THROAT: The oropharynx is clear. Oral mucosa is pink and moist. Hearing is intact to voice.  NECK: Trachea is midline, and there is no jugular venous distension. Thyroid is without palpable abnormalities. LYMPH NODES:  Lymph nodes in the neck are not enlarged. RESPIRATORY:  Lungs are clear, and breath sounds are equal bilaterally. Normal respiratory effort without pathologic use of accessory muscles. CARDIOVASCULAR: Heart is regular without murmurs, gallops, or rubs. GI: The abdomen is soft, nontender, and nondistended. There were no palpable masses. There was no hepatosplenomegaly. There were normal bowel sounds. There is A right Inguinal hernia, mildly tender, reducible.   MUSCULOSKELETAL:  Normal muscle strength and tone in all four extremities.    SKIN: Skin turgor is normal. There are no pathologic skin lesions.  NEUROLOGIC:  Motor and sensation is grossly normal.  Cranial nerves are grossly intact. PSYCH:  Alert and oriented to person, place and time. Affect is normal.  Data Reviewed I have personally reviewed the patient's imaging and medical records.    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 I have recommended repair given that the T colon is In her right chest. Discussed with her in detail about the procedure. We will attempt a laparoscopic repair with mesh placement. Discussed with the patient in detail about the risk, benefits and possible complications including but not limited to: Bleeding, recurrence of diaphragmatic injury, injury to adjacent structures, prolonged hospitalization. I do think that at some point We also need to perform the laparoscopic right inguinal hernia repair in a different setting.  Also discussed with our thoracic surgeon Dr. Faith Rogue and he will be assisting me in this case given the fact that her colon is in the right chest. Extensive counseling provided Caroleen Hamman, MD [place] 04/04/2016, 1:21 PM

## 2016-05-01 NOTE — Transfer of Care (Signed)
Immediate Anesthesia Transfer of Care Note  Patient: Brianna Hammond  Procedure(s) Performed: Procedure(s): LAPAROSCOPIC REPAIR OF MORGAGNI HERNIA (N/A)  Patient Location: PACU  Anesthesia Type:General  Level of Consciousness: sedated and responds to stimulation  Airway & Oxygen Therapy: Patient Spontanous Breathing and Patient connected to face mask oxygen  Post-op Assessment: Report given to RN and Post -op Vital signs reviewed and stable  Post vital signs: Reviewed and stable  Last Vitals:  Filed Vitals:   05/01/16 0616 05/01/16 1020  BP: 113/71 157/84  Pulse: 81 78  Temp: 36.6 C   Resp: 16     Last Pain: There were no vitals filed for this visit.       Complications: No apparent anesthesia complications

## 2016-05-01 NOTE — Addendum Note (Signed)
Addendum  created 05/01/16 1140 by Emmie Niemann, MD   Modules edited: Clinical Notes   Clinical Notes:  File: [id number]

## 2016-05-01 NOTE — Interval H&P Note (Signed)
History and Physical Interval Note:  05/01/2016 7:08 AM  Brianna Hammond  has presented today for surgery, with the diagnosis of Morgagni  hernia  The various methods of treatment have been discussed with the patient and family. After consideration of risks, benefits and other options for treatment, the patient has consented to  Procedure(s): Chunky (N/A) as a surgical intervention .  The patient's history has been reviewed, patient examined, no change in status, stable for surgery.  I have reviewed the patient's chart and labs.  Questions were answered to the patient's satisfaction.     [name]

## 2016-05-01 NOTE — Progress Notes (Signed)
Notified Dr. Dahlia Byes of patient not having voided within 8 hours, bladder scan volume of 113 mL and known hx of having one kidney, acknowledged by MD. Continue to monitor patient and recheck bladder in a few hours, if patient still has not voided. Pt resting in bed. Continue to assess.

## 2016-05-01 NOTE — Anesthesia Procedure Notes (Signed)
Procedure Name: Intubation Performed by: Lance Muss Pre-anesthesia Checklist: Patient identified, Patient being monitored, Timeout performed, Emergency Drugs available and Suction available Patient Re-evaluated:Patient Re-evaluated prior to inductionOxygen Delivery Method: Circle system utilized Preoxygenation: Pre-oxygenation with 100% oxygen Intubation Type: IV induction Ventilation: Mask ventilation without difficulty Laryngoscope Size: Mac, 3 and McGraph Grade View: Grade III Tube type: Oral Tube size: 7.0 mm Number of attempts: 2 Airway Equipment and Method: Stylet and Video-laryngoscopy Placement Confirmation: ETT inserted through vocal cords under direct vision,  positive ETCO2 and breath sounds checked- equal and bilateral Secured at: 20 cm Tube secured with: Tape Dental Injury: Teeth and Oropharynx as per pre-operative assessment  Difficulty Due To: Difficult Airway- due to anterior larynx, Difficulty was anticipated and Difficult Airway- due to reduced neck mobility Future Recommendations: Recommend- induction with short-acting agent, and alternative techniques readily available Comments: Limited neck mobility, mouth opening, and anterior larynx. Unsuccessful with MAC 3, had grade 3 view by CRNA. Attempted second with #3 Mcgrath had grade I view with cricoid pressure. Able to successfully pass ETT.

## 2016-05-02 DIAGNOSIS — K449 Diaphragmatic hernia without obstruction or gangrene: Secondary | ICD-10-CM | POA: Diagnosis not present

## 2016-05-02 LAB — BASIC METABOLIC PANEL
Anion gap: 5 (ref 5–15)
BUN: 10 mg/dL (ref 6–20)
CALCIUM: 8.1 mg/dL — AB (ref 8.9–10.3)
CHLORIDE: 106 mmol/L (ref 101–111)
CO2: 24 mmol/L (ref 22–32)
CREATININE: 0.97 mg/dL (ref 0.44–1.00)
GFR, EST NON AFRICAN AMERICAN: 58 mL/min — AB (ref >60)
Glucose, Bld: 97 mg/dL (ref 65–99)
Potassium: 3.8 mmol/L (ref 3.5–5.1)
SODIUM: 135 mmol/L (ref 135–145)

## 2016-05-02 LAB — CBC
HCT: 33.9 % — ABNORMAL LOW (ref 35.0–47.0)
HEMOGLOBIN: 11.8 g/dL — AB (ref 12.0–16.0)
MCH: 29 pg (ref 26.0–34.0)
MCHC: 34.9 g/dL (ref 32.0–36.0)
MCV: 83.1 fL (ref 80.0–100.0)
Platelets: 128 10*3/uL — ABNORMAL LOW (ref 150–440)
RBC: 4.07 MIL/uL (ref 3.80–5.20)
RDW: 13.6 % (ref 11.5–14.5)
WBC: 4.3 10*3/uL (ref 3.6–11.0)

## 2016-05-02 MED ORDER — KETOROLAC TROMETHAMINE 15 MG/ML IJ SOLN
15.0000 mg | Freq: Four times a day (QID) | INTRAMUSCULAR | Status: DC
  Administered 2016-05-02 – 2016-05-03 (×4): 15 mg via INTRAVENOUS
  Filled 2016-05-02 (×4): qty 1

## 2016-05-02 MED ORDER — GABAPENTIN 300 MG PO CAPS
300.0000 mg | ORAL_CAPSULE | Freq: Three times a day (TID) | ORAL | Status: DC
  Administered 2016-05-02 – 2016-05-03 (×4): 300 mg via ORAL
  Filled 2016-05-02 (×4): qty 1

## 2016-05-02 NOTE — Progress Notes (Signed)
POD # 1 s/p lap Morgagni H Repair Main issue is pain Tolerating PO AVSS Creat ok, good U/O  PE NAD Chest : cta, nsr Abd: soft, incision c/d/i, appropriate incisional tenderness , no peritonitis  A/P doing well Add gabapentin and low dose schedule toradol F/u creat in am and DC in am

## 2016-05-02 NOTE — Progress Notes (Signed)
Initial Nutrition Assessment  DOCUMENTATION CODES:   Not applicable  INTERVENTION:  -Monitor intake cater to pt preferences.  Offered to get something different for breakfast this am and pt declined.  Planning to review menu and order lunch.  -Discussed overall healthy diet encouraging lean protein for wound healing.  Pt verbalized understanding and expect good compliance.   NUTRITION DIAGNOSIS:   Inadequate oral intake related to acute illness as evidenced by per patient/family report.    GOAL:   Patient will meet greater than or equal to 90% of their needs    MONITOR:   PO intake  REASON FOR ASSESSMENT:   Malnutrition Screening Tool    ASSESSMENT:      Pt s/p Morgagni hernia repair.    Past Medical History  Diagnosis Date  . Pituitary adenoma ([place]) 2005  . Kidney calculus   . Trigeminal herpes zoster   . TMJ syndrome   . Arthritis   . GERD (gastroesophageal reflux disease)   . Hepatitis A   . Hypothyroid   . Osteoporosis   . Fibrocystic breast disease   . History of radiation therapy 1/25, 1/27, 1/29, 2/2, 11/15/14    SRS pituitary adenoma 5 Gy in 5 fx, total 25 Gy   Pt reports normal intake prior to admission.  "I eat well and variety of foods."  Only ate few bites of oatmeal this am, tasted like glue.    Medications reviewed: Labs reviewed: hgb 11.8  Diet Order:  Diet regular Room service appropriate?: Yes; Fluid consistency:: Thin  Skin:  Reviewed, no issues  Last BM:  7/19  Height:   Ht Readings from Last 1 Encounters:  05/01/16 5' (1.524 m)    Weight: Pt reports wt gain prior to admission from being on predisone. Wt encounters are reflective of that.  Wt Readings from Last 1 Encounters:  05/01/16 131 lb (59.421 kg)    Ideal Body Weight:     BMI:  Body mass index is 25.58 kg/(m^2).  Estimated Nutritional Needs:   Kcal:  1475-1770 kcals/d  Protein:  71-89 g/d  Fluid:  >/= 1483ml/d  EDUCATION NEEDS:   Education needs  addressed  Kaelen Brennan B. Zenia Resides, Reid Hope King, [place] (pager) Weekend/On-Call pager [phone])

## 2016-05-02 NOTE — Care Management Obs Status (Signed)
[facility] NOTIFICATION   Patient Details  Name: Brianna Hammond MRN: [id number] Date of Birth: July 10, 1946   Medicare Observation Status Notification Given:  Yes signed copy sent to HIM    Beverly Sessions, RN 05/02/2016, 3:35 PM

## 2016-05-02 NOTE — Care Management Important Message (Deleted)
Important Message  Patient Details  Name: SA FEHRMAN MRN: [id number] Date of Birth: 1946/02/04   Medicare Important Message Given:  Yes    Beverly Sessions, RN 05/02/2016, 10:50 AM

## 2016-05-03 DIAGNOSIS — K449 Diaphragmatic hernia without obstruction or gangrene: Secondary | ICD-10-CM | POA: Diagnosis not present

## 2016-05-03 LAB — BASIC METABOLIC PANEL
Anion gap: 6 (ref 5–15)
BUN: 12 mg/dL (ref 6–20)
CALCIUM: 8.1 mg/dL — AB (ref 8.9–10.3)
CO2: 22 mmol/L (ref 22–32)
CREATININE: 1.14 mg/dL — AB (ref 0.44–1.00)
Chloride: 103 mmol/L (ref 101–111)
GFR calc Af Amer: 56 mL/min — ABNORMAL LOW (ref >60)
GFR, EST NON AFRICAN AMERICAN: 48 mL/min — AB (ref >60)
Glucose, Bld: 81 mg/dL (ref 65–99)
Potassium: 3.9 mmol/L (ref 3.5–5.1)
Sodium: 131 mmol/L — ABNORMAL LOW (ref 135–145)

## 2016-05-03 MED ORDER — GABAPENTIN 300 MG PO CAPS: 300.0000 mg | ORAL_CAPSULE | Freq: Three times a day (TID) | ORAL | Status: DC

## 2016-05-03 MED ORDER — CYCLOBENZAPRINE HCL 5 MG PO TABS: 5.0000 mg | ORAL_TABLET | Freq: Three times a day (TID) | ORAL | Status: DC | PRN

## 2016-05-03 MED ORDER — HYDROCODONE-ACETAMINOPHEN 5-325 MG PO TABS: 1.0000 | ORAL_TABLET | Freq: Four times a day (QID) | ORAL | Status: DC | PRN

## 2016-05-03 NOTE — Progress Notes (Signed)
Patient discharged to home as ordered, IV discontinued to left hand, site without S/S of infiltration or infection. Patient instructed to call Dr. Dahlia Byes office for follow up in one week. Patient is alert and oriented, ambulates without assistance, No acute distress noted.

## 2016-05-03 NOTE — Discharge Summary (Signed)
Patient ID: SHAENA TYRELL MRN: [id number] DOB/AGE: 69-Dec-1947 69 y.o.  Admit date: 05/01/2016 Discharge date: 05/03/2016   Discharge Diagnoses:  Active Problems:   Morgagni hernia   Procedures: Laparoscopic repair of Morgagni hernia  Hospital Course: This is a very nice 70 year old female with a history of Morgagni hernia with colon contained within the sac. She underwent an laparoscopic repair with mesh placement and did very well. In the immediate postoperative period in PACU she and was very sensitive to the IV narcotics and developed some brief apnea that responded to Narcan. Postoperatively she did well all other than some pain in postoperative day #1. At the time of discharge ( POD # 2) she was ambulating, she was tolerating regular diet, her vital signs were stable. Her physical exam reveals a female in no acute distress, awake, alert. Her chest was clear to auscultation bilaterally, she was in sinus rhythm without any murmurs or gallops or rubs. Her abdomen was soft with multiple laparoscopic incisions that were healing very well without evidence of infection. No peritonitis. HEr creat and U/O were normal. \ Condition of the patient the time of discharge is stable  Disposition:   Discharge Instructions    Call MD for:  difficulty breathing, headache or visual disturbances    Complete by:  As directed      Call MD for:  extreme fatigue    Complete by:  As directed      Call MD for:  hives    Complete by:  As directed      Call MD for:  persistant dizziness or light-headedness    Complete by:  As directed      Call MD for:  persistant nausea and vomiting    Complete by:  As directed      Call MD for:  redness, tenderness, or signs of infection (pain, swelling, redness, odor or green/yellow discharge around incision site)    Complete by:  As directed      Call MD for:  severe uncontrolled pain    Complete by:  As directed      Call MD for:  temperature >100.4    Complete by:   As directed      Diet - low sodium heart healthy    Complete by:  As directed      Discharge instructions    Complete by:  As directed   Shower starting today     Increase activity slowly    Complete by:  As directed      Lifting restrictions    Complete by:  As directed   20lbs x 6 weeks     No dressing needed    Complete by:  As directed             Medication List    TAKE these medications        bisacodyl 5 MG EC tablet  Commonly known as:  DULCOLAX  Take 4 tablets (20 mg total) by mouth once.     CALCIUM CITRATE + D3 PO  Take 1 tablet by mouth daily.     cyclobenzaprine 5 MG tablet  Commonly known as:  FLEXERIL  Take 1 tablet (5 mg total) by mouth 3 (three) times daily as needed for muscle spasms.     erythromycin base 500 MG tablet  Commonly known as:  E-MYCIN  Take 2 tablets (1,000 mg total) by mouth 3 (three) times daily.     famotidine 20 MG tablet  Commonly known as:  PEPCID  Take 20 mg by mouth as needed.     gabapentin 300 MG capsule  Commonly known as:  NEURONTIN  Take 1 capsule (300 mg total) by mouth 3 (three) times daily.     HYDROcodone-acetaminophen 5-325 MG tablet  Commonly known as:  NORCO/VICODIN  Take 1-2 tablets by mouth every 6 (six) hours as needed for moderate pain.     hydrocortisone 5 MG tablet  Commonly known as:  CORTEF  Take 1.5 tablets by mouth daily.     levothyroxine 50 MCG tablet  Commonly known as:  SYNTHROID, LEVOTHROID  Take 50 mcg by mouth daily.     neomycin 500 MG tablet  Commonly known as:  MYCIFRADIN  Take 2 tablets (1,000 mg total) by mouth 3 (three) times daily.     polyethylene glycol powder powder  Commonly known as:  GLYCOLAX/MIRALAX  Take 255 g by mouth once.     potassium citrate 10 MEQ (1080 MG) SR tablet  Commonly known as:  UROCIT-K  Take 10 mEq by mouth daily.     Red Yeast Rice Extract 600 MG Caps  Take 1 tablet by mouth daily.     sodium chloride 5 % ophthalmic solution  Commonly known  as:  MURO 128  Place 1 drop into both eyes daily.     vitamin B-12 500 MCG tablet  Commonly known as:  CYANOCOBALAMIN  Take 500 mcg by mouth daily.           Follow-up Information    Follow up with Jules Husbands, MD In 1 week.   Specialty:  General Surgery   Contact information:   [address] [place] [address] [phone]        Caroleen Hamman, MD FACS

## 2016-05-07 ENCOUNTER — Encounter: Payer: Self-pay | Admitting: Surgery

## 2016-05-07 ENCOUNTER — Ambulatory Visit (INDEPENDENT_AMBULATORY_CARE_PROVIDER_SITE_OTHER): Payer: Medicare Other | Admitting: Surgery

## 2016-05-07 DIAGNOSIS — Z09 Encounter for follow-up examination after completed treatment for conditions other than malignant neoplasm: Secondary | ICD-10-CM

## 2016-05-07 NOTE — Progress Notes (Signed)
S/p lap Morgagni H repair Doing very well  tolerating Po Apparently did not fill her narcotic prescription but has not needed     PE: NAD Abd:  Incisions c/d/i, healing well no infection  A/P doing very well No heavy lifting RTC 1 month l, we will likely repair the R IH at that time and let her recover from this surgery first

## 2016-05-07 NOTE — Patient Instructions (Signed)
Please follow-up in 5 weeks as scheduled below.  Please call with any questions or concerns.

## 2016-06-09 ENCOUNTER — Ambulatory Visit
Admission: RE | Admit: 2016-06-09 | Discharge: 2016-06-09 | Disposition: A | Discharge status: Home / Self Care | Payer: Medicare Other | Source: Ambulatory Visit | Attending: Radiation Oncology | Admitting: Radiation Oncology

## 2016-06-09 DIAGNOSIS — D352 Benign neoplasm of pituitary gland: Secondary | ICD-10-CM

## 2016-06-09 MED ORDER — GADOBENATE DIMEGLUMINE 529 MG/ML IV SOLN
5.0000 mL | Freq: Once | INTRAVENOUS | Status: AC | PRN
  Administered 2016-06-09: 5 mL via INTRAVENOUS

## 2016-06-11 ENCOUNTER — Encounter: Payer: Self-pay | Admitting: Surgery

## 2016-06-12 ENCOUNTER — Encounter: Payer: Self-pay | Admitting: Surgery

## 2016-06-12 ENCOUNTER — Ambulatory Visit (INDEPENDENT_AMBULATORY_CARE_PROVIDER_SITE_OTHER): Payer: Medicare Other | Admitting: Surgery

## 2016-06-12 VITALS — BP 136/80 | HR 69 | Temp 98.6°F | Wt 125.2 lb

## 2016-06-12 DIAGNOSIS — Z09 Encounter for follow-up examination after completed treatment for conditions other than malignant neoplasm: Secondary | ICD-10-CM

## 2016-06-12 NOTE — Progress Notes (Signed)
S/p lap morgagni H repair. Doing well She has some discomfort wearing a bras an laying on her right side Had a recent appointment with neurosurgery showing evidence of good response from her pituitary tumor No other issues. Her inguinal area does not bother her  PE NAD Incisions, healed, no infection, no hernias Right IH   A/P doing well No complications Hypersthesia on the sternal area should subside with time She wishes to wait a few months before having her inguinal hernia repair.

## 2016-06-12 NOTE — Patient Instructions (Signed)
Please call with any questions or concerns.  If you would like to have your Right Inguinal Hernia repaired in the future, please let us know and this can absolutely be done at [facility] as you have requested.

## 2016-09-01 ENCOUNTER — Encounter: Payer: Self-pay | Admitting: Surgery

## 2016-09-01 ENCOUNTER — Ambulatory Visit: Payer: Medicare Other | Admitting: Surgery

## 2016-09-01 VITALS — BP 139/89 | HR 61 | Temp 97.7°F | Ht 60.0 in | Wt 128.0 lb

## 2016-09-01 DIAGNOSIS — K4090 Unilateral inguinal hernia, without obstruction or gangrene, not specified as recurrent: Secondary | ICD-10-CM

## 2016-09-01 NOTE — Progress Notes (Signed)
70 yo female f/u for [facility], she did very well after her lap Morgagni hernia repair. She has some ocasional discomfort on her right groin She rides motorcycles with he husband and has an upcoming event in the next couple of months HEre for discussion of hernia. No exam performed during this encounter. After explaining the surgery she wishes to postpone the procedure until spring of next year.  She is not having any major issues and will call us back and make an appt when she is ready. Time spent in this encounter was 15 minutes with the majority of time spent in counseling.

## 2016-09-01 NOTE — Patient Instructions (Signed)
Please call our office when you are ready to schedule your surgery.

## 2016-09-10 ENCOUNTER — Other Ambulatory Visit: Payer: Self-pay | Admitting: Family Medicine

## 2016-09-10 DIAGNOSIS — E2740 Unspecified adrenocortical insufficiency: Secondary | ICD-10-CM | POA: Insufficient documentation

## 2016-09-10 DIAGNOSIS — Z1231 Encounter for screening mammogram for malignant neoplasm of breast: Secondary | ICD-10-CM

## 2016-09-10 DIAGNOSIS — Z0000 Encounter for general adult medical examination without abnormal findings: Secondary | ICD-10-CM | POA: Insufficient documentation

## 2016-09-10 DIAGNOSIS — Z7185 Encounter for immunization safety counseling: Secondary | ICD-10-CM

## 2016-09-10 DIAGNOSIS — R7989 Other specified abnormal findings of blood chemistry: Secondary | ICD-10-CM | POA: Insufficient documentation

## 2016-09-10 HISTORY — DX: Unspecified adrenocortical insufficiency: E27.40

## 2016-09-10 HISTORY — DX: Encounter for immunization safety counseling: Z71.85

## 2016-09-10 HISTORY — DX: Other specified abnormal findings of blood chemistry: R79.89

## 2016-09-22 ENCOUNTER — Ambulatory Visit
Admission: RE | Admit: 2016-09-22 | Discharge: 2016-09-22 | Disposition: A | Discharge status: Home / Self Care | Payer: Medicare Other | Source: Ambulatory Visit | Attending: Family Medicine | Admitting: Family Medicine

## 2016-09-22 DIAGNOSIS — Z1231 Encounter for screening mammogram for malignant neoplasm of breast: Secondary | ICD-10-CM

## 2016-09-29 DIAGNOSIS — M810 Age-related osteoporosis without current pathological fracture: Secondary | ICD-10-CM

## 2016-09-29 HISTORY — DX: Age-related osteoporosis without current pathological fracture: M81.0

## 2017-06-08 ENCOUNTER — Other Ambulatory Visit: Payer: Self-pay | Admitting: Radiation Therapy

## 2017-06-08 DIAGNOSIS — D352 Benign neoplasm of pituitary gland: Secondary | ICD-10-CM

## 2017-06-18 ENCOUNTER — Ambulatory Visit
Admission: RE | Admit: 2017-06-18 | Discharge: 2017-06-18 | Disposition: A | Discharge status: Home / Self Care | Payer: Medicare Other | Source: Ambulatory Visit | Attending: Radiation Oncology | Admitting: Radiation Oncology

## 2017-06-18 DIAGNOSIS — D352 Benign neoplasm of pituitary gland: Secondary | ICD-10-CM

## 2017-06-18 MED ORDER — GADOBENATE DIMEGLUMINE 529 MG/ML IV SOLN
6.0000 mL | Freq: Once | INTRAVENOUS | Status: AC | PRN
  Administered 2017-06-18: 6 mL via INTRAVENOUS

## 2017-08-14 ENCOUNTER — Other Ambulatory Visit: Payer: Self-pay

## 2017-08-14 DIAGNOSIS — Z87898 Personal history of other specified conditions: Secondary | ICD-10-CM

## 2017-08-14 HISTORY — DX: Personal history of other specified conditions: Z87.898

## 2017-08-17 ENCOUNTER — Encounter: Payer: Self-pay | Admitting: Surgery

## 2017-08-17 ENCOUNTER — Ambulatory Visit (INDEPENDENT_AMBULATORY_CARE_PROVIDER_SITE_OTHER): Payer: Medicare Other | Admitting: Surgery

## 2017-08-17 VITALS — BP 129/87 | HR 61 | Temp 97.7°F | Ht 60.0 in | Wt 136.6 lb

## 2017-08-17 DIAGNOSIS — K4020 Bilateral inguinal hernia, without obstruction or gangrene, not specified as recurrent: Secondary | ICD-10-CM | POA: Diagnosis not present

## 2017-08-17 NOTE — Patient Instructions (Signed)
You have chose to have your hernia repaired. This will be done by Dr. Caroleen Hamman on 09/23/2017 at [facility].  Please see your (blue) Pre-care information that you have been given today.  You will need to arrange to be out of work for 2 weeks and then return with a lifting restrictions for 4 more weeks. Please send any FMLA paperwork prior to surgery and we will fill this out and fax it back to your employer within 3 business days.  You may have a bruise in your groin and also swelling and brusing in your testicle area. You may use ice 4-5 times daily for 15-20 minutes each time. Make sure that you place a barrier between you and the ice pack. To decrease the swelling, you may roll up a bath towel and place it vertically in between your thighs with your testicles resting on the towel. You will want to keep this area elevated as much as possible for several days following surgery.    Inguinal Hernia, Adult Muscles help keep everything in the body in its proper place. But if a weak spot in the muscles develops, something can poke through. That is called a hernia. When this happens in the lower part of the belly (abdomen), it is called an inguinal hernia. (It takes its name from a part of the body in this region called the inguinal canal.) A weak spot in the wall of muscles lets some fat or part of the small intestine bulge through. An inguinal hernia can develop at any age. Men get them more often than women. CAUSES  In adults, an inguinal hernia develops over time.  It can be triggered by:  Suddenly straining the muscles of the lower abdomen.  Lifting heavy objects.  Straining to have a bowel movement. Difficult bowel movements (constipation) can lead to this.  Constant coughing. This may be caused by smoking or lung disease.  Being overweight.  Being pregnant.  Working at a job that requires long periods of standing or heavy lifting.  Having had an inguinal hernia before. One type can be  an emergency situation. It is called a strangulated inguinal hernia. It develops if part of the small intestine slips through the weak spot and cannot get back into the abdomen. The blood supply can be cut off. If that happens, part of the intestine may die. This situation requires emergency surgery. SYMPTOMS  Often, a small inguinal hernia has no symptoms. It is found when a healthcare provider does a physical exam. Larger hernias usually have symptoms.   In adults, symptoms may include:  A lump in the groin. This is easier to see when the person is standing. It might disappear when lying down.  In men, a lump in the scrotum.  Pain or burning in the groin. This occurs especially when lifting, straining or coughing.  A dull ache or feeling of pressure in the groin.  Signs of a strangulated hernia can include:  A bulge in the groin that becomes very painful and tender to the touch.  A bulge that turns red or purple.  Fever, nausea and vomiting.  Inability to have a bowel movement or to pass gas. DIAGNOSIS  To decide if you have an inguinal hernia, a healthcare provider will probably do a physical examination.  This will include asking questions about any symptoms you have noticed.  The healthcare provider might feel the groin area and ask you to cough. If an inguinal hernia is felt, the healthcare  provider may try to slide it back into the abdomen.  Usually no other tests are needed. TREATMENT  Treatments can vary. The size of the hernia makes a difference. Options include:  Watchful waiting. This is often suggested if the hernia is small and you have had no symptoms.  No medical procedure will be done unless symptoms develop.  You will need to watch closely for symptoms. If any occur, contact your healthcare provider right away.  Surgery. This is used if the hernia is larger or you have symptoms.  Open surgery. This is usually an outpatient procedure (you will not stay  overnight in a hospital). An cut (incision) is made through the skin in the groin. The hernia is put back inside the abdomen. The weak area in the muscles is then repaired by herniorrhaphy or hernioplasty. Herniorrhaphy: in this type of surgery, the weak muscles are sewn back together. Hernioplasty: a patch or mesh is used to close the weak area in the abdominal wall.  Laparoscopy. In this procedure, a surgeon makes small incisions. A thin tube with a tiny video camera (called a laparoscope) is put into the abdomen. The surgeon repairs the hernia with mesh by looking with the video camera and using two long instruments. HOME CARE INSTRUCTIONS   After surgery to repair an inguinal hernia:  You will need to take pain medicine prescribed by your healthcare provider. Follow all directions carefully.  You will need to take care of the wound from the incision.  Your activity will be restricted for awhile. This will probably include no heavy lifting for several weeks. You also should not do anything too active for a few weeks. When you can return to work will depend on the type of job that you have.  During "watchful waiting" periods, you should:  Maintain a healthy weight.  Eat a diet high in fiber (fruits, vegetables and whole grains).  Drink plenty of fluids to avoid constipation. This means drinking enough water and other liquids to keep your urine clear or pale yellow.  Do not lift heavy objects.  Do not stand for long periods of time.  Quit smoking. This should keep you from developing a frequent cough. SEEK MEDICAL CARE IF:   A bulge develops in your groin area.  You feel pain, a burning sensation or pressure in the groin. This might be worse if you are lifting or straining.  You develop a fever of more than 100.5 F (38.1 C). SEEK IMMEDIATE MEDICAL CARE IF:   Pain in the groin increases suddenly.  A bulge in the groin gets bigger suddenly and does not go down.  For men, there  is sudden pain in the scrotum. Or, the size of the scrotum increases.  A bulge in the groin area becomes red or purple and is painful to touch.  You have nausea or vomiting that does not go away.  You feel your heart beating much faster than normal.  You cannot have a bowel movement or pass gas.  You develop a fever of more than 102.0 F (38.9 C).   This information is not intended to replace advice given to you by your health care provider. Make sure you discuss any questions you have with your health care provider.   Document Released: 02/15/2009 Document Revised: 12/22/2011 Document Reviewed: 04/02/2015 Elsevier Interactive Patient Education [employer].

## 2017-08-17 NOTE — Progress Notes (Signed)
Outpatient Surgical Follow Up  08/17/2017  Brianna Hammond is an 71 y.o. female.   Chief Complaint  Patient presents with  . 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 She did very well. She has a known hx of a right IH. She now comes for evaluation. She reports some intermittent mild pain, worsening w movement. No evidence of bowel incarceration. CT personally reviewed. Chronically incarcerated fluid on the right side, I do see another defect on the left as well.  Past Medical History:  Diagnosis Date  . Acquired hypothyroidism 04/04/2016  . Age-related osteoporosis without current pathological fracture 09/29/2016  . Arthritis   . Benign neoplasm of pituitary gland and craniopharyngeal duct ([place]) 11/15/2012  . Calculus of kidney 11/15/2012  . Fibrocystic breast disease   . GERD (gastroesophageal reflux disease)   . Hepatitis A   . History of pituitary tumor 08/14/2017   Overview:  s/p removal  . History of radiation therapy 1/25, 1/27, 1/29, 2/2, 11/15/14   SRS pituitary adenoma 5 Gy in 5 fx, total 25 Gy  . Hypothyroid   . Infection of urinary tract 11/15/2012  . Kidney calculus   . Low serum cortisol level ([place]) 09/10/2016  . Microscopic hematuria 11/15/2012  . Morgagni hernia   . 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 Osteoporosis   . Pituitary adenoma ([place]) 2005  . Pituitary adenoma ([place]) 10/30/2014  . Pure hypercholesterolemia 04/04/2016  . Small kidney, unilateral 12/20/2014  . TMJ syndrome   . Trigeminal herpes zoster   . Vaccine counseling 09/10/2016    Past Surgical History:  Procedure Laterality Date  . BREAST BIOPSY Left 1986  . NEPHRECTOMY RADICAL Left 08/2003   for metanephric adenoma  . TONSILLECTOMY     age 64  . TRANSPHENOIDAL PITUITARY RESECTION  03/14/2004   infarcted pituitary tumor with features most suggestive of pituitary adenoma    Family History   Problem Relation Age of Onset  . Cancer Mother        liver  . Lymphoma Mother   . Breast cancer Neg Hx     Social History:  reports that  has never smoked. she has never used smokeless tobacco. She reports that she drinks alcohol. She reports that she does not use drugs.  Allergies:  Allergies  Allergen Reactions  . Dexamethasone Sodium Phosphate Hives  . Ezetimibe Other (See Comments)  . Decadron [Dexamethasone] Other (See Comments)    Flushing, hives  . Latex Rash    And blisters    Medications reviewed.    ROS Full ROS performed and is otherwise negative other than what is stated in HPI   BP 129/87   Pulse 61   Temp 97.7 F (36.5 C) (Oral)   Ht 5' (1.524 m)   Wt 62 kg (136 lb 9.6 oz)   BMI 26.68 kg/m    Physical Exam  Constitutional: She is oriented to person, place, and time and well-developed, well-nourished, and in no distress. No distress.  Eyes: Right eye exhibits no discharge. Left eye exhibits no discharge. No scleral icterus.  Neck: No JVD present.  Cardiovascular: Intact distal pulses.  Pulmonary/Chest: Effort normal. No stridor. No respiratory distress. She exhibits no tenderness.  Abdominal: Soft. She exhibits no distension. There is no tenderness. There is no rebound and no guarding.  Right IH w chronic incarceration, some tenderness. No peritonitis./ Left  IH reducible.  Neurological: She is alert and oriented to person, place, and time. Gait normal. GCS score is 15.  Skin: Skin is warm and dry. She is not diaphoretic.  Psychiatric: Mood, memory, affect and judgment normal.  Nursing note and vitals reviewed.    Assessment/Plan: Symptomatic Bilateral Inguinal hernia. D/W the pt   in detail about my recommendation for repair. I do recommend a lap BIH repair.   I have explained the procedure, risks, and aftercare of inguinal hernias repair to Brianna Hammond.   Risks include but are not limited to bleeding, infection, wound problems, anesthesia,  recurrence, bladder or intestine injury, urinary retention, chronic pain, mesh problems.  She  seems to understand and agrees to proceed.  Questions were answered to her stated satisfaction. She wishes to wait a few weeks after thanks given. We will schedule for Dec 12th. She is agreeable.      Caroleen Hamman, MD [facility] General Surgeon

## 2017-08-18 ENCOUNTER — Telehealth: Payer: Self-pay | Admitting: Surgery

## 2017-08-18 NOTE — Telephone Encounter (Signed)
Pt advised of pre op date/time and sx date. Sx: 09/23/17 with Dr Pabon--laparoscopic bilateral inguinal hernia repair.  Pre op: 09/15/17 @ 8:15am--Phone interview.   Patient made aware to call [phone], between 1-3:00pm the day before surgery, to find out what time to arrive.

## 2017-08-27 ENCOUNTER — Telehealth: Payer: Self-pay | Admitting: Surgery

## 2017-08-27 NOTE — Telephone Encounter (Signed)
Patient has called and would like to reschedule her surgery. I have spoken with Dr Dahlia Byes and he is aware of the change of date for the surgery. He will update the H&P the day of surgery. Orders are in. Surgery date has been changed from 09/23/17 to 10/28/16.  Pt advised of pre op date/time and sx date. Sx: 10/28/16 with Dr Pabon--Laparoscopic bilateral inguinal hernia repair.  Pre op: 10/21/16 @ 12:30pm--Office interview. Patient will see the anesthesiologist in person the day of this appointment.   Patient made aware to call [phone], between 1-3:00pm the day before surgery, to find out what time to arrive.

## 2017-09-09 ENCOUNTER — Other Ambulatory Visit: Payer: Self-pay | Admitting: Family Medicine

## 2017-09-09 DIAGNOSIS — Z1231 Encounter for screening mammogram for malignant neoplasm of breast: Secondary | ICD-10-CM

## 2017-09-15 ENCOUNTER — Other Ambulatory Visit: Payer: Medicare Other

## 2017-10-15 ENCOUNTER — Ambulatory Visit
Admission: RE | Admit: 2017-10-15 | Discharge: 2017-10-15 | Disposition: A | Discharge status: Home / Self Care | Payer: Medicare Other | Source: Ambulatory Visit | Attending: Family Medicine | Admitting: Family Medicine

## 2017-10-15 DIAGNOSIS — Z1231 Encounter for screening mammogram for malignant neoplasm of breast: Secondary | ICD-10-CM | POA: Insufficient documentation

## 2017-10-20 ENCOUNTER — Telehealth: Payer: Self-pay | Admitting: Surgery

## 2017-10-20 NOTE — Telephone Encounter (Signed)
Please update pharmacy to [facility] on [address]

## 2017-10-21 ENCOUNTER — Encounter
Admission: RE | Admit: 2017-10-21 | Discharge: 2017-10-21 | Disposition: A | Discharge status: Home / Self Care | Payer: Medicare Other | Source: Ambulatory Visit | Attending: Surgery | Admitting: Surgery

## 2017-10-21 ENCOUNTER — Other Ambulatory Visit: Payer: Self-pay

## 2017-10-21 DIAGNOSIS — Z01818 Encounter for other preprocedural examination: Secondary | ICD-10-CM | POA: Insufficient documentation

## 2017-10-21 DIAGNOSIS — R001 Bradycardia, unspecified: Secondary | ICD-10-CM | POA: Diagnosis not present

## 2017-10-21 DIAGNOSIS — Z79899 Other long term (current) drug therapy: Secondary | ICD-10-CM | POA: Diagnosis not present

## 2017-10-21 HISTORY — DX: Personal history of urinary calculi: Z87.442

## 2017-10-21 LAB — BASIC METABOLIC PANEL
ANION GAP: 8 (ref 5–15)
BUN: 20 mg/dL (ref 6–20)
CALCIUM: 9.9 mg/dL (ref 8.9–10.3)
CO2: 26 mmol/L (ref 22–32)
Chloride: 99 mmol/L — ABNORMAL LOW (ref 101–111)
Creatinine, Ser: 1.12 mg/dL — ABNORMAL HIGH (ref 0.44–1.00)
GFR calc Af Amer: 56 mL/min — ABNORMAL LOW (ref >60)
GFR, EST NON AFRICAN AMERICAN: 49 mL/min — AB (ref >60)
GLUCOSE: 87 mg/dL (ref 65–99)
Potassium: 4.3 mmol/L (ref 3.5–5.1)
Sodium: 133 mmol/L — ABNORMAL LOW (ref 135–145)

## 2017-10-21 LAB — CBC
HEMATOCRIT: 41.7 % (ref 35.0–47.0)
Hemoglobin: 13.9 g/dL (ref 12.0–16.0)
MCH: 28.5 pg (ref 26.0–34.0)
MCHC: 33.3 g/dL (ref 32.0–36.0)
MCV: 85.6 fL (ref 80.0–100.0)
Platelets: 181 10*3/uL (ref 150–440)
RBC: 4.87 MIL/uL (ref 3.80–5.20)
RDW: 13.2 % (ref 11.5–14.5)
WBC: 4.8 10*3/uL (ref 3.6–11.0)

## 2017-10-21 NOTE — Telephone Encounter (Signed)
Patient's pharmacy has been updated.

## 2017-10-21 NOTE — Anesthesia Pain Management Evaluation Note (Signed)
DR PABON ORDERED ANESTH CONSULT. ISSUE OF NARCOTIC SENSITIVITY  AFTER DILAUDID GIVEN IV IN PACU 7/17 REQUIRED NARCAN TO REVERSE AFTER BRIEF SPELL OF APNEA DISCUSSED WITH DR Assunta Gambles. DILAUDID ADDED TO LIST OF DRUG ALLERGIES. PATIENT STATES SHE IS SENSITIVE AND NEEDS LESS THAN USUAL WITH MOST DRUGS. CONCERNED SHE DID NOT REMEMBER 2 DAYS INPATIENT.

## 2017-10-21 NOTE — Patient Instructions (Signed)
Your procedure is scheduled on:10/28/17 Report to Day Surgery. Medical mall second floor To find out your arrival time please call [phone] between 1PM - 3PM on 10/27/17.  Remember: Instructions that are not followed completely may result in serious medical risk, up to and including death, or upon the discretion of your surgeon and anesthesiologist your surgery may need to be rescheduled.     _X__ 1. Do not eat food after midnight the night before your procedure.                 No gum chewing or hard candies. You may drink clear liquids up to 2 hours                 before you are scheduled to arrive for your surgery- DO not drink clear                 liquids within 2 hours of the start of your surgery.                 Clear Liquids include:  water, apple juice without pulp, clear carbohydrate                 drink such as Clearfast of Gartorade, Black Coffee or Tea (Do not add                 anything to coffee or tea).     _X__ 2.  No Alcohol for 24 hours before or after surgery.   _X__ 3.  Do Not Smoke or use e-cigarettes For 24 Hours Prior to Your Surgery.                 Do not use any chewable tobacco products for at least 6 hours prior to                 surgery.  ____  4.  Bring all medications with you on the day of surgery if instructed.   __X__  5.  Notify your doctor if there is any change in your medical condition      (cold, fever, infections).     Do not wear jewelry, make-up, hairpins, clips or nail polish. Do not wear lotions, powders, or perfumes. You may wear deodorant. Do not shave 48 hours prior to surgery. Men may shave face and neck. Do not bring valuables to the hospital.    [facility] is not responsible for any belongings or valuables.  Contacts, dentures or bridgework may not be worn into surgery. Leave your suitcase in the car. After surgery it may be brought to your room. For patients admitted to the hospital, discharge time  is determined by your treatment team.   Patients discharged the day of surgery will not be allowed to drive home.   Please read over the following fact sheets that you were given:   Surgical Site Infection Prevention          _X Take these medicines the morning of surgery with A SIP OF WATER:    1. CORTEF  2. LEVOTHYROXINE  3.   4.  5.  6.  ____ Fleet Enema (as directed)   _X___ Use CHG Soap as directed  ____ Use inhalers on the day of surgery  ____ Stop metformin 2 days prior to surgery    ____ Take 1/2 of usual insulin dose the night before surgery. No insulin the morning  of surgery.   ____ Stop Coumadin/Plavix/aspirin on ____ Stop Anti-inflammatories on   _X___ Stop supplements until after surgery.   STOP RED YEAST RICE UNTIL AFTER SURGERY  ____ Bring C-Pap to the hospital.

## 2017-10-26 DIAGNOSIS — N183 Chronic kidney disease, stage 3 unspecified: Secondary | ICD-10-CM | POA: Insufficient documentation

## 2017-10-27 MED ORDER — CEFAZOLIN SODIUM-DEXTROSE 2-4 GM/100ML-% IV SOLN
2.0000 g | INTRAVENOUS | Status: AC
  Administered 2017-10-28: 2 g via INTRAVENOUS

## 2017-10-28 ENCOUNTER — Encounter
Admission: RE | Disposition: A | Discharge status: Home / Self Care | Payer: Self-pay | Source: Ambulatory Visit | Attending: Surgery

## 2017-10-28 ENCOUNTER — Ambulatory Visit
Admission: RE | Admit: 2017-10-28 | Discharge: 2017-10-28 | Disposition: A | Discharge status: Home / Self Care | Payer: Medicare Other | Source: Ambulatory Visit | Attending: Surgery | Admitting: Surgery

## 2017-10-28 ENCOUNTER — Ambulatory Visit: Payer: Medicare Other | Admitting: Certified Registered Nurse Anesthetist

## 2017-10-28 ENCOUNTER — Other Ambulatory Visit: Payer: Self-pay

## 2017-10-28 DIAGNOSIS — Z87442 Personal history of urinary calculi: Secondary | ICD-10-CM | POA: Diagnosis not present

## 2017-10-28 DIAGNOSIS — Z79899 Other long term (current) drug therapy: Secondary | ICD-10-CM | POA: Diagnosis not present

## 2017-10-28 DIAGNOSIS — K219 Gastro-esophageal reflux disease without esophagitis: Secondary | ICD-10-CM | POA: Diagnosis not present

## 2017-10-28 DIAGNOSIS — E7800 Pure hypercholesterolemia, unspecified: Secondary | ICD-10-CM | POA: Insufficient documentation

## 2017-10-28 DIAGNOSIS — K4020 Bilateral inguinal hernia, without obstruction or gangrene, not specified as recurrent: Secondary | ICD-10-CM | POA: Diagnosis not present

## 2017-10-28 DIAGNOSIS — K4090 Unilateral inguinal hernia, without obstruction or gangrene, not specified as recurrent: Secondary | ICD-10-CM

## 2017-10-28 DIAGNOSIS — E039 Hypothyroidism, unspecified: Secondary | ICD-10-CM | POA: Diagnosis not present

## 2017-10-28 HISTORY — PX: INGUINAL HERNIA REPAIR: SHX194

## 2017-10-28 SURGERY — REPAIR, HERNIA, INGUINAL, BILATERAL, LAPAROSCOPIC
Anesthesia: General | Laterality: Bilateral

## 2017-10-28 MED ORDER — PROPOFOL 10 MG/ML IV BOLUS
INTRAVENOUS | Status: AC
  Filled 2017-10-28: qty 20

## 2017-10-28 MED ORDER — SUGAMMADEX SODIUM 200 MG/2ML IV SOLN
INTRAVENOUS | Status: DC | PRN
  Administered 2017-10-28: 125 mg via INTRAVENOUS

## 2017-10-28 MED ORDER — ACETAMINOPHEN 10 MG/ML IV SOLN
INTRAVENOUS | Status: DC | PRN
  Administered 2017-10-28: 1000 mg via INTRAVENOUS

## 2017-10-28 MED ORDER — FENTANYL CITRATE (PF) 100 MCG/2ML IJ SOLN
INTRAMUSCULAR | Status: AC
  Filled 2017-10-28: qty 2

## 2017-10-28 MED ORDER — LABETALOL HCL 5 MG/ML IV SOLN
INTRAVENOUS | Status: DC | PRN
  Administered 2017-10-28: 5 mg via INTRAVENOUS

## 2017-10-28 MED ORDER — HYDROCODONE-ACETAMINOPHEN 5-325 MG PO TABS: 1.0000 | ORAL_TABLET | Freq: Four times a day (QID) | ORAL | Status: DC | PRN

## 2017-10-28 MED ORDER — ROCURONIUM BROMIDE 100 MG/10ML IV SOLN
INTRAVENOUS | Status: DC | PRN
  Administered 2017-10-28: 10 mg via INTRAVENOUS
  Administered 2017-10-28: 30 mg via INTRAVENOUS

## 2017-10-28 MED ORDER — FENTANYL CITRATE (PF) 100 MCG/2ML IJ SOLN
25.0000 ug | INTRAMUSCULAR | Status: DC | PRN
  Administered 2017-10-28: 25 ug via INTRAVENOUS

## 2017-10-28 MED ORDER — HYDROCODONE-ACETAMINOPHEN 5-325 MG PO TABS: 1.0000 | ORAL_TABLET | Freq: Four times a day (QID) | ORAL | 0 refills | Status: DC | PRN

## 2017-10-28 MED ORDER — EPHEDRINE SULFATE 50 MG/ML IJ SOLN
INTRAMUSCULAR | Status: DC | PRN
  Administered 2017-10-28: 10 mg via INTRAVENOUS

## 2017-10-28 MED ORDER — PROPOFOL 10 MG/ML IV BOLUS
INTRAVENOUS | Status: DC | PRN
  Administered 2017-10-28: 120 mg via INTRAVENOUS

## 2017-10-28 MED ORDER — FAMOTIDINE 20 MG PO TABS
20.0000 mg | ORAL_TABLET | Freq: Once | ORAL | Status: AC
  Administered 2017-10-28: 20 mg via ORAL

## 2017-10-28 MED ORDER — KETOROLAC TROMETHAMINE 30 MG/ML IJ SOLN
INTRAMUSCULAR | Status: DC | PRN
  Administered 2017-10-28: 15 mg via INTRAVENOUS

## 2017-10-28 MED ORDER — LIDOCAINE HCL (PF) 2 % IJ SOLN
INTRAMUSCULAR | Status: AC
  Filled 2017-10-28: qty 10

## 2017-10-28 MED ORDER — FAMOTIDINE 20 MG PO TABS
ORAL_TABLET | ORAL | Status: AC
  Administered 2017-10-28: 20 mg via ORAL
  Filled 2017-10-28: qty 1

## 2017-10-28 MED ORDER — GLYCOPYRROLATE 0.2 MG/ML IJ SOLN
INTRAMUSCULAR | Status: AC
  Filled 2017-10-28: qty 1

## 2017-10-28 MED ORDER — CHLORHEXIDINE GLUCONATE CLOTH 2 % EX PADS: 6.0000 | MEDICATED_PAD | Freq: Once | CUTANEOUS | Status: DC

## 2017-10-28 MED ORDER — SUCCINYLCHOLINE CHLORIDE 20 MG/ML IJ SOLN
INTRAMUSCULAR | Status: AC
  Filled 2017-10-28: qty 1

## 2017-10-28 MED ORDER — LIDOCAINE HCL (CARDIAC) 20 MG/ML IV SOLN
INTRAVENOUS | Status: DC | PRN
  Administered 2017-10-28: 80 mg via INTRAVENOUS

## 2017-10-28 MED ORDER — BUPIVACAINE-EPINEPHRINE (PF) 0.25% -1:200000 IJ SOLN
INTRAMUSCULAR | Status: DC | PRN
  Administered 2017-10-28: 30 mL via PERINEURAL

## 2017-10-28 MED ORDER — CEFAZOLIN SODIUM-DEXTROSE 2-4 GM/100ML-% IV SOLN
INTRAVENOUS | Status: AC
  Filled 2017-10-28: qty 100

## 2017-10-28 MED ORDER — ONDANSETRON HCL 4 MG/2ML IJ SOLN
INTRAMUSCULAR | Status: DC | PRN
  Administered 2017-10-28: 4 mg via INTRAVENOUS

## 2017-10-28 MED ORDER — LACTATED RINGERS IV SOLN
INTRAVENOUS | Status: DC
  Administered 2017-10-28: 10:00:00 via INTRAVENOUS

## 2017-10-28 MED ORDER — ROCURONIUM BROMIDE 50 MG/5ML IV SOLN
INTRAVENOUS | Status: AC
  Filled 2017-10-28: qty 1

## 2017-10-28 MED ORDER — DEXAMETHASONE SODIUM PHOSPHATE 10 MG/ML IJ SOLN
INTRAMUSCULAR | Status: AC
  Filled 2017-10-28: qty 1

## 2017-10-28 MED ORDER — FENTANYL CITRATE (PF) 100 MCG/2ML IJ SOLN
INTRAMUSCULAR | Status: DC | PRN
  Administered 2017-10-28 (×3): 25 ug via INTRAVENOUS

## 2017-10-28 MED ORDER — ONDANSETRON HCL 4 MG/2ML IJ SOLN: 4.0000 mg | Freq: Once | INTRAMUSCULAR | Status: DC | PRN

## 2017-10-28 MED ORDER — ACETAMINOPHEN 10 MG/ML IV SOLN
INTRAVENOUS | Status: AC
  Filled 2017-10-28: qty 100

## 2017-10-28 MED ORDER — DEXAMETHASONE SODIUM PHOSPHATE 10 MG/ML IJ SOLN
INTRAMUSCULAR | Status: DC | PRN
  Administered 2017-10-28: 5 mg via INTRAVENOUS

## 2017-10-28 MED ORDER — ONDANSETRON HCL 4 MG/2ML IJ SOLN
INTRAMUSCULAR | Status: AC
  Filled 2017-10-28: qty 2

## 2017-10-28 MED ORDER — LABETALOL HCL 5 MG/ML IV SOLN
INTRAVENOUS | Status: AC
  Filled 2017-10-28: qty 4

## 2017-10-28 SURGICAL SUPPLY — 42 items
ADH SKN CLS APL DERMABOND .7 (GAUZE/BANDAGES/DRESSINGS) ×1
APPLICATOR COTTON TIP 6IN STRL (MISCELLANEOUS) ×2 IMPLANT
BALN DSCT LAPSCP LRG KNDY DSTN (BALLOONS) ×1
CANISTER SUCT 1200ML W/VALVE (MISCELLANEOUS) ×2 IMPLANT
CHLORAPREP W/TINT 26ML (MISCELLANEOUS) ×2 IMPLANT
DEFOGGER SCOPE WARMER CLEARIFY (MISCELLANEOUS) ×2 IMPLANT
DERMABOND ADVANCED (GAUZE/BANDAGES/DRESSINGS) ×1
DERMABOND ADVANCED .7 DNX12 (GAUZE/BANDAGES/DRESSINGS) ×1 IMPLANT
DEVICE SECURE STRAP 25 ABSORB (INSTRUMENTS) ×2 IMPLANT
DISSECT BALLN SPACEMKR OVL PDB (BALLOONS) ×2
DISSECTOR BALLN SPCMKR OVL PDB (BALLOONS) ×1 IMPLANT
DISSECTOR KITTNER STICK (MISCELLANEOUS) ×2 IMPLANT
DISSECTORS/KITTNER STICK (MISCELLANEOUS) ×4
DRAPE INCISE IOBAN 66X45 STRL (DRAPES) ×2 IMPLANT
ELECT REM PT RETURN 9FT ADLT (ELECTROSURGICAL) ×2
ELECTRODE REM PT RTRN 9FT ADLT (ELECTROSURGICAL) ×1 IMPLANT
ENDOLOOP SUT PDS II  0 18 (SUTURE) ×2
ENDOLOOP SUT PDS II 0 18 (SUTURE) IMPLANT
GLOVE BIO SURGEON STRL SZ7 (GLOVE) ×2 IMPLANT
GOWN STRL REUS W/ TWL LRG LVL3 (GOWN DISPOSABLE) ×2 IMPLANT
GOWN STRL REUS W/TWL LRG LVL3 (GOWN DISPOSABLE) ×4
IRRIGATION STRYKERFLOW (MISCELLANEOUS) ×1 IMPLANT
IRRIGATOR STRYKERFLOW (MISCELLANEOUS) ×2
IV NS 1000ML (IV SOLUTION) ×2
IV NS 1000ML BAXH (IV SOLUTION) ×1 IMPLANT
L-HOOK LAP DISP 36CM (ELECTROSURGICAL)
LHOOK LAP DISP 36CM (ELECTROSURGICAL) IMPLANT
MESH 3DMAX 3X5 LT MED (Mesh General) ×1 IMPLANT
MESH 3DMAX 4X6 RT LRG (Mesh General) ×1 IMPLANT
NEEDLE HYPO 22GX1.5 SAFETY (NEEDLE) ×2 IMPLANT
NS IRRIG 500ML POUR BTL (IV SOLUTION) ×2 IMPLANT
PACK LAP CHOLECYSTECTOMY (MISCELLANEOUS) ×2 IMPLANT
PENCIL ELECTRO HAND CTR (MISCELLANEOUS) ×2 IMPLANT
SCISSORS METZENBAUM CVD 33 (INSTRUMENTS) ×2 IMPLANT
SPONGE LAP 18X18 5 PK (GAUZE/BANDAGES/DRESSINGS) ×2 IMPLANT
SURGILUBE 2OZ TUBE FLIPTOP (MISCELLANEOUS) ×2 IMPLANT
SUT MNCRL AB 4-0 PS2 18 (SUTURE) ×2 IMPLANT
SUT VICRYL 0 AB UR-6 (SUTURE) ×2 IMPLANT
TRAY FOLEY CATH SILVER 16FR LF (SET/KITS/TRAYS/PACK) IMPLANT
TROCAR 5MM SINGLE VERSAONE (TROCAR) ×4 IMPLANT
TROCAR BALLN 10M OMST10SB SPAC (TROCAR) ×2 IMPLANT
TUBING INSUFFLATION (TUBING) ×2 IMPLANT

## 2017-10-28 NOTE — Anesthesia Preprocedure Evaluation (Addendum)
Anesthesia Evaluation  Patient identified by MRN, date of birth, ID band Patient awake    Reviewed: Allergy & Precautions, NPO status , Patient's Chart, lab work & pertinent test results, reviewed documented beta blocker date and time   Airway Mallampati: III  TM Distance: >3 FB     Dental  (+) Chipped   Pulmonary           Cardiovascular      Neuro/Psych  Neuromuscular disease    GI/Hepatic GERD  Controlled,(+) Hepatitis -  Endo/Other  Hypothyroidism   Renal/GU Renal disease     Musculoskeletal  (+) Arthritis ,   Abdominal   Peds  Hematology   Anesthesia Other Findings Took solucortef this am. No TMJ problem. Allergic to dilaudid, decadron. Can take morphine.  Reproductive/Obstetrics                          Anesthesia Physical Anesthesia Plan  ASA: III  Anesthesia Plan: General   Post-op Pain Management:    Induction:   PONV Risk Score and Plan:   Airway Management Planned: Oral ETT  Additional Equipment:   Intra-op Plan:   Post-operative Plan:   Informed Consent: I have reviewed the patients History and Physical, chart, labs and discussed the procedure including the risks, benefits and alternatives for the proposed anesthesia with the patient or authorized representative who has indicated his/her understanding and acceptance.     Plan Discussed with: CRNA  Anesthesia Plan Comments:         Anesthesia Quick Evaluation

## 2017-10-28 NOTE — Op Note (Signed)
Laparoscopic Bilateral Inguinal and femoral  Hernias   Repair  Brianna Hammond  10/28/2017  Pre-operative Diagnosis: Bilateral Inguinal Hernia  Post-operative Diagnosis: Bilateral  Inguinal and femoral hernias  Procedure: Laparoscopic preperitoneal repair of bilateral inguinal hernias w 3 D mesh  Surgeon: Caroleen Hamman, MD FACS  Anesthesia: Gen. with endotracheal tube  Findings:  Bilateral Direct IH and bilateral femoral hernias  Procedure Details  The patient was seen again in the Holding Room. The benefits, complications, treatment options, and expected outcomes were discussed with the patient. The risks of bleeding, infection, recurrence of symptoms, failure to resolve symptoms, recurrence of hernia, ischemic orchitis, chronic pain syndrome or neuroma, were discussed again. The likelihood of improving the patient's symptoms with return to their baseline status is good.  The patient and/or family concurred with the proposed plan, giving informed consent.  The patient was taken to Operating Room, identified as [name] and the procedure verified as Laparoscopic Inguinal Hernia Repair. Laterality confirmed.  A Time Out was held and the above information confirmed.  Prior to the induction of general anesthesia, antibiotic prophylaxis was administered. VTE prophylaxis was in place. General endotracheal anesthesia was then administered and tolerated well. After the induction, the abdomen was prepped with Chloraprep and draped in the sterile fashion. The patient was positioned in the supine position.  Local anesthetic  was injected into the skin near the umbilicus and an incision made. An incision was made and dissection down to the rectus fascia was performed. The fascia was incised and the muscle retracted laterally. The Covidien dissecting balloon was placed followed by the structural balloon. The preperitoneal space was insufflated and under direct vision 2 midline 5 mm ports were  placed.  Dissection was performed to delineate Cooper's ligament and the lateral extent of dissection was determined on each side. The nerve on the lateral abdominal wall was identified and kept in view at all times. The round ligament was skeletonized of the sac and a lipoma which was retracted cephalad on each side. We visualized  Dual defects both direct inguinal and femoral hernias.  Once this was complete, a large 3 D mesh was placed into the preperitoneal space on each side. They were held in place with the absorbable tacking device avoiding the area of the nerve. Once assuring that the hernias were completely repaired and adequately covered, the preperitoneal space was desufflated under direct vision. There was a small  peritoneal rent  On the right side that was repaired using a loop pds in the usual fashion, there was no sign of bowel intrusion towards the mesh.  Once assuring that hemostasis was adequate the ports were removed and a figure-of-eight 0 Vicryl suture was placed at the fascial edges. 4-0 subcuticular Monocryl was used at all skin edges. Dermabond was placed.  Patient tolerated the procedure well. There were no complications. He was taken to the recovery room in stable condition.               Caroleen Hamman, MD, FACS

## 2017-10-28 NOTE — Anesthesia Procedure Notes (Signed)
Procedure Name: Intubation Date/Time: 10/28/2017 10:59 AM Performed by: Johnna Acosta, CRNA Pre-anesthesia Checklist: Patient identified, Emergency Drugs available, Suction available, Patient being monitored and Timeout performed Patient Re-evaluated:Patient Re-evaluated prior to induction Oxygen Delivery Method: Circle system utilized Preoxygenation: Pre-oxygenation with 100% oxygen Induction Type: IV induction Ventilation: Mask ventilation without difficulty and Oral airway inserted - appropriate to patient size Laryngoscope Size: Sabra Heck and 2 Grade View: Grade I Tube type: Oral Tube size: 7.0 mm Number of attempts: 1 Airway Equipment and Method: Stylet Placement Confirmation: ETT inserted through vocal cords under direct vision,  positive ETCO2 and breath sounds checked- equal and bilateral Secured at: 21 cm Tube secured with: Tape Dental Injury: Teeth and Oropharynx as per pre-operative assessment  Difficulty Due To: Difficulty was unanticipated, Difficult Airway- due to anterior larynx and Difficult Airway- due to limited oral opening Future Recommendations: Recommend- induction with short-acting agent, and alternative techniques readily available

## 2017-10-28 NOTE — Transfer of Care (Signed)
Immediate Anesthesia Transfer of Care Note  Patient: Brianna Hammond  Procedure(s) Performed: LAPAROSCOPIC BILATERAL INGUINAL HERNIA REPAIR (Bilateral )  Patient Location: PACU  Anesthesia Type:General  Level of Consciousness: awake, alert  and oriented  Airway & Oxygen Therapy: Patient Spontanous Breathing and Patient connected to face mask oxygen  Post-op Assessment: Report given to RN and Post -op Vital signs reviewed and stable  Post vital signs: Reviewed and stable  Last Vitals:  Vitals:   10/28/17 0946 10/28/17 1243  BP: 132/86   Pulse: 68 70  Resp: 16 15  Temp: (!) 36.4 C   SpO2: 100% 100%    Last Pain:  Vitals:   10/28/17 1243  TempSrc: Tympanic         Complications: No apparent anesthesia complications

## 2017-10-28 NOTE — Anesthesia Post-op Follow-up Note (Signed)
Anesthesia QCDR form completed.        

## 2017-10-28 NOTE — Anesthesia Postprocedure Evaluation (Signed)
Anesthesia Post Note  Patient: Brianna Hammond  Procedure(s) Performed: LAPAROSCOPIC BILATERAL INGUINAL HERNIA REPAIR (Bilateral )  Patient location during evaluation: PACU Anesthesia Type: General Level of consciousness: awake and alert Pain management: pain level controlled Vital Signs Assessment: post-procedure vital signs reviewed and stable Respiratory status: spontaneous breathing, nonlabored ventilation, respiratory function stable and patient connected to nasal cannula oxygen Cardiovascular status: blood pressure returned to baseline and stable Postop Assessment: no apparent nausea or vomiting Anesthetic complications: no     Last Vitals:  Vitals:   10/28/17 1335 10/28/17 1422  BP: 114/66 111/63  Pulse: (!) 59 60  Resp: 16   Temp: 36.7 C   SpO2: 100% 100%    Last Pain:  Vitals:   10/28/17 1335  TempSrc:   PainSc: 10-Worst pain ever                 [name] S

## 2017-10-28 NOTE — Discharge Instructions (Addendum)
Laparoscopic Inguinal Hernia Repair, Adult, Care After This sheet gives you information about how to care for yourself after your procedure. Your health care provider may also give you more specific instructions. If you have problems or questions, contact your health care provider. What can I expect after the procedure? After the procedure, it is common to have:  Pain.  Swelling and bruising around the incision area.  Scrotal swelling, in men.  Some fluid or blood draining from your incisions.  Follow these instructions at home: Incision care  Follow instructions from your health care provider about how to take care of your incisions. Make sure you: ? Wash your hands with soap and water before you change your bandage (dressing). If soap and water are not available, use hand sanitizer. ? Change your dressing as told by your health care provider. ? Leave stitches (sutures), skin glue, or adhesive strips in place. These skin closures may need to stay in place for 2 weeks or longer. If adhesive strip edges start to loosen and curl up, you may trim the loose edges. Do not remove adhesive strips completely unless your health care provider tells you to do that.  Check your incision area every day for signs of infection. Check for: ? More redness, swelling, or pain. ? More fluid or blood. ? Warmth. ? Pus or a bad smell.  Wear loose, soft clothing while your incisions heal. Driving  Do not drive or use heavy machinery while taking prescription pain medicine.  Do not drive for 24 hours if you were given a medicine to help you relax (sedative) during your procedure. Activity  Do not lift anything that is heavier than 10 lb (4.5 kg), or the limit that you are told, until your health care provider says that it is safe.  Ask your health care provider what activities are safe for you.A lot of activity during the first week after surgery can increase pain and swelling. For 1 week after your  procedure: ? Avoid activities that take a lot of effort, such as exercise or sports. ? You may walk and climb stairs as needed for daily activity, but avoid long walks or climbing stairs for exercise. Managing pain and swelling  Put ice on painful or swollen areas: ? Put ice in a plastic bag. ? Place a towel between your skin and the bag. ? Leave the ice on for 20 minutes, 2-3 times a day. General instructions  Do not take baths, swim, or use a hot tub until your health care provider approves. Ask your health care provider if you may take showers. You may only be allowed to take sponge baths.  Take over-the-counter and prescription medicines only as told by your health care provider.  To prevent or treat constipation while you are taking prescription pain medicine, your health care provider may recommend that you: ? Drink enough fluid to keep your urine pale yellow. ? Take over-the-counter or prescription medicines. ? Eat foods that are high in fiber, such as fresh fruits and vegetables, whole grains, and beans. ? Limit foods that are high in fat and processed sugars, such as fried and sweet foods.  Do not use any products that contain nicotine or tobacco, such as cigarettes and e-cigarettes. If you need help quitting, ask your health care provider.  Drink enough fluid to keep your urine pale yellow.  Keep all follow-up visits as told by your health care provider. This is important. Contact a health care provider if:  You  have more redness, swelling, or pain around your incisions or your groin area.  You have more swelling in your scrotum.  You have more fluid or blood coming from your incisions.  Your incisions feel warm to the touch.  You have severe pain and medicines do not help.  You have abdominal pain or swelling.  You cannot eat or drink without vomiting.  You cannot urinate or pass a bowel movement.  You faint.  You feel dizzy.  You have nausea and  vomiting.  You have a fever. Get help right away if:  You have pus or a bad smell coming from your incisions.  You have chest pain.  You have problems breathing. Summary  Pain, swelling, and bruising are common after the procedure.  Check your incision area every day for signs of infection, such as more redness, swelling, or pain.  Put ice on painful or swollen areas for 20 minutes, 2-3 times a day. This information is not intended to replace advice given to you by your health care provider. Make sure you discuss any questions you have with your health care provider. Document Released: 01/08/2017 Document Revised: 01/08/2017 Document Reviewed: 01/08/2017 Elsevier Interactive Patient Education  2018 [employer]   1) The drugs that you were given will stay in your system until tomorrow so for the next 24 hours you should not:  A) Drive an automobile B) Make any legal decisions C) Drink any alcoholic beverage   2) You may resume regular meals tomorrow.  Today it is better to start with liquids and gradually work up to solid foods.  You may eat anything you prefer, but it is better to start with liquids, then soup and crackers, and gradually work up to solid foods.   3) Please notify your doctor immediately if you have any unusual bleeding, trouble breathing, redness and pain at the surgery site, drainage, fever, or pain not relieved by medication.    4) Additional Instructions:        Please contact your physician with any problems or Same Day Surgery at [phone], Monday through Friday 6 am to 4 pm, or [place] at [facility] number at [phone].AMBULATORY SURGERY  DISCHARGE INSTRUCTIONS   5) The drugs that you were given will stay in your system until tomorrow so for the next 24 hours you should not:  D) Drive an automobile E) Make any legal decisions F) Drink any alcoholic beverage   6) You  may resume regular meals tomorrow.  Today it is better to start with liquids and gradually work up to solid foods.  You may eat anything you prefer, but it is better to start with liquids, then soup and crackers, and gradually work up to solid foods.   7) Please notify your doctor immediately if you have any unusual bleeding, trouble breathing, redness and pain at the surgery site, drainage, fever, or pain not relieved by medication.    8) Additional Instructions:        Please contact your physician with any problems or Same Day Surgery at [phone], Monday through Friday 6 am to 4 pm, or [place] at [facility] number at [phone].

## 2017-10-28 NOTE — H&P (Addendum)
Brianna Hammond is an 72 y.o. female.       Chief Complaint  Patient presents with  . 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 She did very well. She has a known hx of a right IH. She now comes for evaluation. She reports some intermittent mild pain, worsening w movement. No evidence of bowel incarceration. CT personally reviewed. Chronically incarcerated fluid on the right side, I do see another defect on the left as well.      Past Medical History:  Diagnosis Date  . Acquired hypothyroidism 04/04/2016  . Age-related osteoporosis without current pathological fracture 09/29/2016  . Arthritis   . Benign neoplasm of pituitary gland and craniopharyngeal duct ([place]) 11/15/2012  . Calculus of kidney 11/15/2012  . Fibrocystic breast disease   . GERD (gastroesophageal reflux disease)   . Hepatitis A   . History of pituitary tumor 08/14/2017   Overview:  s/p removal  . History of radiation therapy 1/25, 1/27, 1/29, 2/2, 11/15/14   SRS pituitary adenoma 5 Gy in 5 fx, total 25 Gy  . Hypothyroid   . Infection of urinary tract 11/15/2012  . Kidney calculus   . Low serum cortisol level ([place]) 09/10/2016  . Microscopic hematuria 11/15/2012  . Morgagni hernia   . 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 Osteoporosis   . Pituitary adenoma ([place]) 2005  . Pituitary adenoma ([place]) 10/30/2014  . Pure hypercholesterolemia 04/04/2016  . Small kidney, unilateral 12/20/2014  . TMJ syndrome   . Trigeminal herpes zoster   . Vaccine counseling 09/10/2016         Past Surgical History:  Procedure Laterality Date  . BREAST BIOPSY Left 1986  . NEPHRECTOMY RADICAL Left 08/2003   for metanephric adenoma  . TONSILLECTOMY     age 18  . TRANSPHENOIDAL PITUITARY RESECTION  03/14/2004   infarcted pituitary tumor with features most suggestive of pituitary adenoma         Family History   Problem Relation Age of Onset  . Cancer Mother        liver  . Lymphoma Mother   . Breast cancer Neg Hx     Social History:  reports that  has never smoked. she has never used smokeless tobacco. She reports that she drinks alcohol. She reports that she does not use drugs.  Allergies:       Allergies  Allergen Reactions  . Dexamethasone Sodium Phosphate Hives  . Ezetimibe Other (See Comments)  . Decadron [Dexamethasone] Other (See Comments)    Flushing, hives  . Latex Rash    And blisters    Medications reviewed.    ROS Full ROS performed and is otherwise negative other than what is stated in HPI      Physical Exam  Constitutional: She is oriented to person, place, and time and well-developed, well-nourished, and in no distress. No distress.  Eyes: Right eye exhibits no discharge. Left eye exhibits no discharge. No scleral icterus.  Neck: No JVD present.  Cardiovascular: Intact distal pulses. NSR, s1,s2 Pulmonary/Chest: Effort normal. No stridor. No respiratory distress. She exhibits no tenderness. Lungs CTA Abdominal: Soft. She exhibits no distension. There is no tenderness. There is no rebound and no guarding.  Right IH w chronic incarceration, some tenderness. No peritonitis./ Left IH reducible.  Neurological: She is alert and oriented to person, place, and time. Gait normal.  GCS score is 15.  Skin: Skin is warm and dry. She is not diaphoretic.  Psychiatric: Mood, memory, affect and judgment normal.  Nursing note and vitals reviewed.    Assessment/Plan: Symptomatic Bilateral Inguinal hernia. D/W the pt   in detail about my recommendation for repair. I do recommend a lap BIH repair.   I have explained the procedure, risks, and aftercare of inguinal hernias repair to [name].   Risks include but are not limited to bleeding, infection, wound problems, anesthesia, recurrence, bladder or intestine injury, urinary retention, chronic pain,  mesh problems.  She  seems to understand and agrees to proceed.  Questions were answered to her stated satisfaction. She wishes to wait a few weeks after thanks given. She is agreeable.      Caroleen Hamman, MD [facility] General Surgeon

## 2017-10-28 NOTE — Anesthesia Postprocedure Evaluation (Signed)
Anesthesia Post Note  Patient: Brianna Hammond  Procedure(s) Performed: LAPAROSCOPIC BILATERAL INGUINAL HERNIA REPAIR (Bilateral )  Patient location during evaluation: PACU Anesthesia Type: General Level of consciousness: awake and alert Pain management: pain level controlled Vital Signs Assessment: post-procedure vital signs reviewed and stable Respiratory status: spontaneous breathing, nonlabored ventilation, respiratory function stable and patient connected to nasal cannula oxygen Cardiovascular status: blood pressure returned to baseline and stable Postop Assessment: no apparent nausea or vomiting Anesthetic complications: no     Last Vitals:  Vitals:   10/28/17 1335 10/28/17 1422  BP: 114/66 111/63  Pulse: (!) 59 60  Resp: 16   Temp: 36.7 C   SpO2: 100% 100%    Last Pain:  Vitals:   10/28/17 1335  TempSrc:   PainSc: 10-Worst pain ever                 Josaiah Muhammed S

## 2017-10-29 ENCOUNTER — Encounter: Payer: Self-pay | Admitting: Surgery

## 2017-11-11 ENCOUNTER — Encounter: Payer: Self-pay | Admitting: General Surgery

## 2017-11-11 ENCOUNTER — Ambulatory Visit (INDEPENDENT_AMBULATORY_CARE_PROVIDER_SITE_OTHER): Payer: Medicare Other | Admitting: General Surgery

## 2017-11-11 VITALS — BP 147/75 | HR 68 | Temp 98.2°F | Ht 61.0 in | Wt 138.6 lb

## 2017-11-11 DIAGNOSIS — Z4889 Encounter for other specified surgical aftercare: Secondary | ICD-10-CM

## 2017-11-11 NOTE — Progress Notes (Signed)
Outpatient Surgical Follow Up  11/11/2017  Brianna Hammond is an 72 y.o. female.   Chief Complaint  Patient presents with  . Routine Post Op    Laproscopic Bilateral Inguinal Hernia repair-10/28/17     HPI: 72 year old female returns to clinic 2 weeks status post laparoscopic bilateral inguinal hernia repairs.  Patient reports doing well.  She has not required any pain medications for the last week and a half.  She is eating well and having normal bowel function for her.  She states she still feels a palpable knot in the right groin but it does not change in size.  She still has some discomfort to her midline incisions causing her to wear looser pants but states that that is also improving.  No other complaints.  Past Medical History:  Diagnosis Date  . Acquired hypothyroidism 04/04/2016  . Age-related osteoporosis without current pathological fracture 09/29/2016  . Arthritis   . Benign neoplasm of pituitary gland and craniopharyngeal duct ([place]) 11/15/2012  . Calculus of kidney 11/15/2012  . Fibrocystic breast disease   . GERD (gastroesophageal reflux disease)   . Hepatitis A    age 58  . History of kidney stones   . History of pituitary tumor 08/14/2017   Overview:  s/p removal  . History of radiation therapy 1/25, 1/27, 1/29, 2/2, 11/15/14   SRS pituitary adenoma 5 Gy in 5 fx, total 25 Gy  . Hypothyroid   . Infection of urinary tract 11/15/2012  . Kidney calculus   . Low serum cortisol level ([place]) 09/10/2016  . Microscopic hematuria 11/15/2012  . Morgagni hernia   . 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 Osteoporosis   . Pituitary adenoma ([place]) 2005  . Pituitary adenoma ([place]) 10/30/2014  . Pure hypercholesterolemia 04/04/2016  . Small kidney, unilateral 12/20/2014  . TMJ syndrome   . Trigeminal herpes zoster   . Vaccine counseling 09/10/2016    Past Surgical History:  Procedure Laterality Date  . BRAIN SURGERY    . BREAST BIOPSY Left 1986  . HERNIA REPAIR     morgagnia  . HIATAL HERNIA REPAIR N/A 05/01/2016   Procedure: LAPAROSCOPIC REPAIR OF MORGAGNI HERNIA;  Surgeon: Jules Husbands, MD;  Location: [facility] ORS;  Service: General;  Laterality: N/A;  . INGUINAL HERNIA REPAIR Bilateral 10/28/2017   Procedure: LAPAROSCOPIC BILATERAL INGUINAL HERNIA REPAIR;  Surgeon: Jules Husbands, MD;  Location: [facility] ORS;  Service: General;  Laterality: Bilateral;  . NEPHRECTOMY RADICAL Left 08/2003   for metanephric adenoma  . TONSILLECTOMY     age 26  . TRANSPHENOIDAL PITUITARY RESECTION  03/14/2004   infarcted pituitary tumor with features most suggestive of pituitary adenoma    Family History  Problem Relation Age of Onset  . Cancer Mother        liver  . Lymphoma Mother   . Breast cancer Neg Hx     Social History:  reports that  has never smoked. she has never used smokeless tobacco. She reports that she drinks alcohol. She reports that she does not use drugs.  Allergies:  Allergies  Allergen Reactions  . Dilaudid [Hydromorphone Hcl] Anaphylaxis    Apnea/ required narcan in pacu to reverse  . Dexamethasone Sodium Phosphate Hives  . Ezetimibe Other (See Comments)    Muscle pain   . Decadron [Dexamethasone] Other (See Comments)    Flushing, hives  . Latex Rash    And blisters    Medications reviewed.    ROS  A multipoint review of systems was completed, all pertinent positives and negatives are documented within the HPI and the remainder are negative   BP (!) 147/75   Pulse 68   Temp 98.2 F (36.8 C) (Oral)   Ht 5\' 1"  (1.549 m)   Wt 62.9 kg (138 lb 9.6 oz)   BMI 26.19 kg/m   Physical Exam General: No acute distress Chest: Clear to auscultation Heart: Regular rate and rhythm Abdomen: Soft, nondistended.  Well approximated and healing laparoscopic incision sites.  Resolving ecchymosis to the middle incision.  Minimal hyperemia around the lower incisions plate.  Palpable area in the right groin does not change with Valsalva.  No evidence of  palpable area in the left groin.    No results found for this or any previous visit (from the past 48 hour(s)). No results found.  Assessment/Plan:  1. Aftercare following surgery 72 year old female status post laparoscopic bilateral inguinal hernia repair by my partner.  Doing very well.  Discussed signs and symptoms of infection or recurrence and return to clinic immediately should they occur.  Otherwise provided with standard postoperative precautions.  She will follow-up on an as-needed basis.     Clayburn Pert, MD FACS General Surgeon  11/11/2017,2:10 PM

## 2017-11-11 NOTE — Patient Instructions (Addendum)

## 2018-06-07 ENCOUNTER — Encounter: Payer: Self-pay | Admitting: Urology

## 2018-06-07 ENCOUNTER — Ambulatory Visit (INDEPENDENT_AMBULATORY_CARE_PROVIDER_SITE_OTHER): Payer: Medicare Other | Admitting: Urology

## 2018-06-07 VITALS — BP 129/75 | HR 73 | Ht 61.0 in | Wt 125.0 lb

## 2018-06-07 DIAGNOSIS — N200 Calculus of kidney: Secondary | ICD-10-CM | POA: Diagnosis not present

## 2018-06-07 LAB — URINALYSIS, COMPLETE
BILIRUBIN UA: NEGATIVE
Glucose, UA: NEGATIVE
KETONES UA: NEGATIVE
LEUKOCYTES UA: NEGATIVE
Nitrite, UA: NEGATIVE
Protein, UA: NEGATIVE
SPEC GRAV UA: 1.015 (ref 1.005–1.030)
Urobilinogen, Ur: 0.2 mg/dL (ref 0.2–1.0)
pH, UA: 5.5 (ref 5.0–7.5)

## 2018-06-07 LAB — MICROSCOPIC EXAMINATION
Bacteria, UA: NONE SEEN
Epithelial Cells (non renal): NONE SEEN /hpf (ref 0–10)
WBC, UA: NONE SEEN /hpf (ref 0–5)

## 2018-06-07 NOTE — Progress Notes (Signed)
06/07/2018 10:22 AM   Brianna Hammond Sep 12, 1946 [id number]  Referring provider: Dion Body, MD [facility] [place], [place] [address]  Chief Complaint  Patient presents with  . New Patient (Initial Visit)    transfer of care    HPI: Patient followed by Dr. Jacqlyn Larsen for single right kidney and a very small stone.  She passed a stone in 2005.  She had a left nephrectomy for benign tumor in 2004.  He generally followed her every second year with an ultrasound and every second year with a KUB.  At baseline she voids every 2 or 3 hours and is no incontinence and gets up once a night.  She has not had bladder surgery.  She has been known to have chronic microscopic hematuria for 15 years  Modifying factors: There are no other modifying factors  Associated signs and symptoms: There are no other associated signs and symptoms Aggravating and relieving factors: There are no other aggravating or relieving factors Severity: Moderate Duration: Persistent   PMH: Past Medical History:  Diagnosis Date  . Acquired hypothyroidism 04/04/2016  . Age-related osteoporosis without current pathological fracture 09/29/2016  . Arthritis   . Benign neoplasm of pituitary gland and craniopharyngeal duct ([place]) 11/15/2012  . Calculus of kidney 11/15/2012  . Fibrocystic breast disease   . GERD (gastroesophageal reflux disease)   . Hepatitis A    age 10  . History of kidney stones   . History of pituitary tumor 08/14/2017   Overview:  s/p removal  . History of radiation therapy 1/25, 1/27, 1/29, 2/2, 11/15/14   SRS pituitary adenoma 5 Gy in 5 fx, total 25 Gy  . Hypothyroid   . Infection of urinary tract 11/15/2012  . Kidney calculus   . Low serum cortisol level ([place]) 09/10/2016  . Microscopic hematuria 11/15/2012  . Morgagni hernia   . 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 Osteoporosis   . Pituitary adenoma ([place]) 2005  . Pituitary adenoma ([place])  10/30/2014  . Pure hypercholesterolemia 04/04/2016  . Small kidney, unilateral 12/20/2014  . TMJ syndrome   . Trigeminal herpes zoster   . Vaccine counseling 09/10/2016    Surgical History: Past Surgical History:  Procedure Laterality Date  . BRAIN SURGERY    . BREAST BIOPSY Left 1986  . HERNIA REPAIR     morgagnia  . HIATAL HERNIA REPAIR N/A 05/01/2016   Procedure: LAPAROSCOPIC REPAIR OF MORGAGNI HERNIA;  Surgeon: Jules Husbands, MD;  Location: [facility] ORS;  Service: General;  Laterality: N/A;  . INGUINAL HERNIA REPAIR Bilateral 10/28/2017   Procedure: LAPAROSCOPIC BILATERAL INGUINAL HERNIA REPAIR;  Surgeon: Jules Husbands, MD;  Location: [facility] ORS;  Service: General;  Laterality: Bilateral;  . NEPHRECTOMY RADICAL Left 08/2003   for metanephric adenoma  . TONSILLECTOMY     age 80  . TRANSPHENOIDAL PITUITARY RESECTION  03/14/2004   infarcted pituitary tumor with features most suggestive of pituitary adenoma    Home Medications:  Allergies as of 06/07/2018      Reactions   Dilaudid [hydromorphone Hcl] Anaphylaxis   Apnea/ required narcan in pacu to reverse   Dexamethasone Sodium Phosphate Hives   Ezetimibe Other (See Comments)   Muscle pain    Decadron [dexamethasone] Other (See Comments)   Flushing, hives   Latex Rash   And blisters      Medication List        Accurate as of 06/07/18 10:22 AM. Always use  your most recent med list.          calcium carbonate 500 MG chewable tablet Commonly known as:  TUMS - dosed in mg elemental calcium Chew 1 tablet by mouth daily as needed for indigestion or heartburn.   CALCIUM CITRATE + D3 PO Take 1 tablet by mouth daily.   hydrocortisone 5 MG tablet Commonly known as:  CORTEF Take 5 mg by mouth daily.   levothyroxine 50 MCG tablet Commonly known as:  SYNTHROID, LEVOTHROID Take 50 mcg by mouth daily.   potassium citrate 10 MEQ (1080 MG) SR tablet Commonly known as:  UROCIT-K Take 10 mEq by mouth daily.   pravastatin 20 MG  tablet Commonly known as:  PRAVACHOL Take 20 mg by mouth daily.   vitamin B-12 1000 MCG tablet Commonly known as:  CYANOCOBALAMIN Take 1,000 mcg by mouth daily.       Allergies:  Allergies  Allergen Reactions  . Dilaudid [Hydromorphone Hcl] Anaphylaxis    Apnea/ required narcan in pacu to reverse  . Dexamethasone Sodium Phosphate Hives  . Ezetimibe Other (See Comments)    Muscle pain   . Decadron [Dexamethasone] Other (See Comments)    Flushing, hives  . Latex Rash    And blisters    Family History: Family History  Problem Relation Age of Onset  . Cancer Mother        liver  . Lymphoma Mother   . Breast cancer Neg Hx     Social History:  reports that she has never smoked. She has never used smokeless tobacco. She reports that she drinks alcohol. She reports that she does not use drugs.  ROS: UROLOGY Frequent Urination?: No Hard to postpone urination?: No Burning/pain with urination?: No Get up at night to urinate?: No Leakage of urine?: No Urine stream starts and stops?: No Trouble starting stream?: No Do you have to strain to urinate?: No Blood in urine?: No Urinary tract infection?: No Sexually transmitted disease?: No Injury to kidneys or bladder?: No Painful intercourse?: No Weak stream?: No Currently pregnant?: No Vaginal bleeding?: No Last menstrual period?: n  Gastrointestinal Nausea?: No Vomiting?: No Indigestion/heartburn?: No Diarrhea?: No Constipation?: No  Constitutional Fever: No Night sweats?: No Weight loss?: No Fatigue?: No  Skin Skin rash/lesions?: No Itching?: No  Eyes Blurred vision?: No Double vision?: No  Ears/Nose/Throat Sore throat?: No Sinus problems?: No  Hematologic/Lymphatic Swollen glands?: No Easy bruising?: No  Cardiovascular Leg swelling?: No Chest pain?: No  Respiratory Cough?: No Shortness of breath?: No  Endocrine Excessive thirst?: No  Musculoskeletal Back pain?: No Joint pain?:  No  Neurological Headaches?: No Dizziness?: No  Psychologic Depression?: No Anxiety?: No  Physical Exam: BP 129/75   Pulse 73   Ht 5\' 1"  (1.549 m)   Wt 125 lb (56.7 kg)   BMI 23.62 kg/m   Constitutional:  Alert and oriented, No acute distress. HEENT:  AT, moist mucus membranes.  Trachea midline, no masses. Cardiovascular: No clubbing, cyanosis, or edema. Respiratory: Normal respiratory effort, no increased work of breathing. GI: Abdomen is soft, nontender, nondistended, no abdominal masses GU: No CVA tenderness.  No bladder tenderness Skin: No rashes, bruises or suspicious lesions. Lymph: No cervical or inguinal adenopathy. Neurologic: Grossly intact, no focal deficits, moving all 4 extremities. Psychiatric: Normal mood and affect.  Laboratory Data: Lab Results  Component Value Date   WBC 4.8 10/21/2017   HGB 13.9 10/21/2017   HCT 41.7 10/21/2017   MCV 85.6 10/21/2017   PLT  181 10/21/2017    Lab Results  Component Value Date   CREATININE 1.12 (H) 10/21/2017    No results found for: PSA  No results found for: TESTOSTERONE  No results found for: HGBA1C  Urinalysis No results found for: COLORURINE, APPEARANCEUR, LABSPEC, PHURINE, GLUCOSEU, HGBUR, BILIRUBINUR, KETONESUR, PROTEINUR, UROBILINOGEN, NITRITE, LEUKOCYTESUR  Pertinent Imaging:   Assessment & Plan: Patient has a single right kidney.  In May 2017 she had a CT stone protocol with no obvious stone in the right kidney.  I thought it was reasonable to get a renal ultrasound and a KUB for best documentation this year.  I would have her follow-up with Dr. Diamantina Providence for long-term surveillance and care  1. Calculus of kidney  - Urinalysis, Complete   Return in about 3 weeks (around 06/28/2018) for 3wk follow up w/[facility].  Reece Packer, MD  [facility] [address], [place], [place] [address] [phone]

## 2018-06-17 ENCOUNTER — Other Ambulatory Visit: Payer: Self-pay | Admitting: Radiation Therapy

## 2018-06-17 DIAGNOSIS — D352 Benign neoplasm of pituitary gland: Secondary | ICD-10-CM

## 2018-06-29 ENCOUNTER — Ambulatory Visit
Admission: RE | Admit: 2018-06-29 | Discharge: 2018-06-29 | Disposition: A | Discharge status: Home / Self Care | Payer: Medicare Other | Source: Ambulatory Visit | Attending: Urology | Admitting: Urology

## 2018-06-29 DIAGNOSIS — N200 Calculus of kidney: Secondary | ICD-10-CM | POA: Insufficient documentation

## 2018-06-29 DIAGNOSIS — K5900 Constipation, unspecified: Secondary | ICD-10-CM | POA: Diagnosis not present

## 2018-07-02 ENCOUNTER — Other Ambulatory Visit: Payer: Medicare Other

## 2018-07-06 ENCOUNTER — Encounter: Payer: Self-pay | Admitting: Urology

## 2018-07-06 ENCOUNTER — Other Ambulatory Visit: Payer: Medicare Other

## 2018-07-06 ENCOUNTER — Ambulatory Visit (INDEPENDENT_AMBULATORY_CARE_PROVIDER_SITE_OTHER): Payer: Medicare Other | Admitting: Urology

## 2018-07-06 VITALS — BP 106/68 | HR 67 | Ht 61.0 in | Wt 127.7 lb

## 2018-07-06 DIAGNOSIS — Z87442 Personal history of urinary calculi: Secondary | ICD-10-CM

## 2018-07-06 LAB — MICROSCOPIC EXAMINATION
Bacteria, UA: NONE SEEN
Epithelial Cells (non renal): NONE SEEN /hpf (ref 0–10)
WBC UA: NONE SEEN /HPF (ref 0–5)

## 2018-07-06 LAB — URINALYSIS, COMPLETE
BILIRUBIN UA: NEGATIVE
GLUCOSE, UA: NEGATIVE
KETONES UA: NEGATIVE
Leukocytes, UA: NEGATIVE
NITRITE UA: NEGATIVE
Protein, UA: NEGATIVE
Specific Gravity, UA: 1.02 (ref 1.005–1.030)
UUROB: 0.2 mg/dL (ref 0.2–1.0)
pH, UA: 5.5 (ref 5.0–7.5)

## 2018-07-06 NOTE — Progress Notes (Signed)
   07/06/2018 11:25 AM   Brianna Hammond 12-26-1945 [id number]  Reason for visit: Follow up solitary kidney and history of nephrolithiasis  HPI: I had the pleasure of seeing Brianna Hammond in urology clinic today for discussion of nephrolithiasis.  She is a 72 year old female with a history of a left radical nephrectomy for an AML in 2004, and she passed a stone on the right side in 2005.  She has had no other stones since that time.  She was previously managed on potassium citrate with Dr. Jacqlyn Larsen, however she stopped this medication secondary to cost.  She denies any flank pain at this time.  Additionally, she has had chronic microscopic hematuria for over 15 years, which is confirmed today with 3-10 RBCs on urinalysis.  She denies any prior cystoscopy.  Last cross-sectional imaging was May 2017 with no stone noted in the right kidney.  A renal ultrasound and KUB were recently obtained which also do not demonstrate any nephrolithiasis.  ROS: Please see flowsheet from today's date for complete review of systems.  Physical Exam: BP 106/68 (BP Location: Left Arm, Patient Position: Sitting, Cuff Size: Normal)   Pulse 67   Ht 5\' 1"  (1.549 m)   Wt 127 lb 11.2 oz (57.9 kg)   BMI 24.13 kg/m    Constitutional:  Alert and oriented, No acute distress. Respiratory: Normal respiratory effort, no increased work of breathing. GI: Abdomen is soft, nontender, nondistended, no abdominal masses GU: No CVA tenderness Skin: No rashes, bruises or suspicious lesions. Neurologic: Grossly intact, no focal deficits, moving all 4 extremities. Psychiatric: Normal mood and affect  Laboratory Data: Serum creatinine 1.12  Urinalysis today 0 WBCs, 3-10 RBCs, no epithelial cells, no bacteria, nitrite negative  Pertinent Imaging:  I have personally reviewed the recent renal ultrasound and KUB, no obvious urolithiasis.  No hydronephrosis  Assessment & Plan:   In summary, Brianna Hammond is a 72 year old female with a  history of a left nephrectomy for AML, and a single stone episode on the right side, as well as chronic microscopic hematuria that has not undergone work-up previously.  Regarding her microscopic hematuria I did recommend cystoscopy and consideration of CT urogram since this is never been evaluated.  She currently has a lot going on with her husband's health and she would like to defer this at this time.  We discussed the possibility of missing a bladder tumor or other malignancy, she understands these risk.  She may be amenable to cystoscopy in the future.  We discussed general stone prevention strategies including adequate hydration with goal of producing 2.5 L of urine daily, increasing citric acid intake, increasing calcium intake during high oxalate meals, minimizing animal protein, and decreasing salt intake. Information about dietary recommendations given today.  We discussed strategies for increasing citrate in the diet since potassium citrate is cost prohibitive.  Return in about 1 year (around 07/07/2019) for KUB and ultrasound.  Billey Co, [facility] [address], [place], [place] [address] [phone]

## 2018-10-26 ENCOUNTER — Other Ambulatory Visit: Payer: Self-pay | Admitting: Family Medicine

## 2018-11-16 ENCOUNTER — Other Ambulatory Visit: Payer: Self-pay | Admitting: Family Medicine

## 2018-11-16 DIAGNOSIS — Z1231 Encounter for screening mammogram for malignant neoplasm of breast: Secondary | ICD-10-CM

## 2018-11-30 ENCOUNTER — Ambulatory Visit
Admission: RE | Admit: 2018-11-30 | Discharge: 2018-11-30 | Disposition: A | Discharge status: Home / Self Care | Payer: PPO | Source: Ambulatory Visit | Attending: Family Medicine | Admitting: Family Medicine

## 2018-11-30 DIAGNOSIS — Z1231 Encounter for screening mammogram for malignant neoplasm of breast: Secondary | ICD-10-CM | POA: Diagnosis not present

## 2018-12-22 ENCOUNTER — Other Ambulatory Visit: Payer: Self-pay | Admitting: Radiation Therapy

## 2018-12-23 ENCOUNTER — Ambulatory Visit
Admission: RE | Admit: 2018-12-23 | Discharge: 2018-12-23 | Disposition: A | Discharge status: Home / Self Care | Payer: PPO | Source: Ambulatory Visit | Attending: Radiation Oncology | Admitting: Radiation Oncology

## 2018-12-23 ENCOUNTER — Other Ambulatory Visit: Payer: Self-pay

## 2018-12-23 DIAGNOSIS — D352 Benign neoplasm of pituitary gland: Secondary | ICD-10-CM | POA: Diagnosis not present

## 2018-12-23 MED ORDER — GADOBENATE DIMEGLUMINE 529 MG/ML IV SOLN
8.0000 mL | Freq: Once | INTRAVENOUS | Status: AC | PRN
  Administered 2018-12-23: 8 mL via INTRAVENOUS

## 2018-12-24 ENCOUNTER — Other Ambulatory Visit: Payer: Medicare Other

## 2018-12-27 ENCOUNTER — Inpatient Hospital Stay: Payer: PPO | Attending: Radiation Oncology

## 2018-12-29 DIAGNOSIS — D497 Neoplasm of unspecified behavior of endocrine glands and other parts of nervous system: Secondary | ICD-10-CM | POA: Diagnosis not present

## 2019-01-15 DIAGNOSIS — B0052 Herpesviral keratitis: Secondary | ICD-10-CM | POA: Diagnosis not present

## 2019-01-24 DIAGNOSIS — B0052 Herpesviral keratitis: Secondary | ICD-10-CM | POA: Diagnosis not present

## 2019-02-22 DIAGNOSIS — N183 Chronic kidney disease, stage 3 (moderate): Secondary | ICD-10-CM | POA: Diagnosis not present

## 2019-02-22 DIAGNOSIS — E7800 Pure hypercholesterolemia, unspecified: Secondary | ICD-10-CM | POA: Diagnosis not present

## 2019-03-01 DIAGNOSIS — N183 Chronic kidney disease, stage 3 (moderate): Secondary | ICD-10-CM | POA: Diagnosis not present

## 2019-03-01 DIAGNOSIS — Z0000 Encounter for general adult medical examination without abnormal findings: Secondary | ICD-10-CM | POA: Diagnosis not present

## 2019-03-01 DIAGNOSIS — E7800 Pure hypercholesterolemia, unspecified: Secondary | ICD-10-CM | POA: Diagnosis not present

## 2019-03-14 DIAGNOSIS — B0052 Herpesviral keratitis: Secondary | ICD-10-CM | POA: Diagnosis not present

## 2019-04-04 DIAGNOSIS — E230 Hypopituitarism: Secondary | ICD-10-CM | POA: Diagnosis not present

## 2019-04-19 DIAGNOSIS — E2749 Other adrenocortical insufficiency: Secondary | ICD-10-CM | POA: Diagnosis not present

## 2019-04-19 DIAGNOSIS — E230 Hypopituitarism: Secondary | ICD-10-CM | POA: Diagnosis not present

## 2019-04-19 DIAGNOSIS — E039 Hypothyroidism, unspecified: Secondary | ICD-10-CM | POA: Diagnosis not present

## 2019-04-19 DIAGNOSIS — D497 Neoplasm of unspecified behavior of endocrine glands and other parts of nervous system: Secondary | ICD-10-CM | POA: Diagnosis not present

## 2019-06-23 DIAGNOSIS — Z85828 Personal history of other malignant neoplasm of skin: Secondary | ICD-10-CM | POA: Diagnosis not present

## 2019-06-23 DIAGNOSIS — L821 Other seborrheic keratosis: Secondary | ICD-10-CM | POA: Diagnosis not present

## 2019-06-23 DIAGNOSIS — D2272 Melanocytic nevi of left lower limb, including hip: Secondary | ICD-10-CM | POA: Diagnosis not present

## 2019-06-23 DIAGNOSIS — D2262 Melanocytic nevi of left upper limb, including shoulder: Secondary | ICD-10-CM | POA: Diagnosis not present

## 2019-06-23 DIAGNOSIS — D225 Melanocytic nevi of trunk: Secondary | ICD-10-CM | POA: Diagnosis not present

## 2019-06-23 DIAGNOSIS — D2271 Melanocytic nevi of right lower limb, including hip: Secondary | ICD-10-CM | POA: Diagnosis not present

## 2019-06-23 DIAGNOSIS — Z08 Encounter for follow-up examination after completed treatment for malignant neoplasm: Secondary | ICD-10-CM | POA: Diagnosis not present

## 2019-06-23 DIAGNOSIS — D2261 Melanocytic nevi of right upper limb, including shoulder: Secondary | ICD-10-CM | POA: Diagnosis not present

## 2019-06-28 DIAGNOSIS — E7800 Pure hypercholesterolemia, unspecified: Secondary | ICD-10-CM | POA: Diagnosis not present

## 2019-06-28 DIAGNOSIS — N183 Chronic kidney disease, stage 3 (moderate): Secondary | ICD-10-CM | POA: Diagnosis not present

## 2019-07-04 ENCOUNTER — Other Ambulatory Visit: Payer: Self-pay

## 2019-07-04 ENCOUNTER — Ambulatory Visit
Admission: RE | Admit: 2019-07-04 | Discharge: 2019-07-04 | Disposition: A | Discharge status: Home / Self Care | Payer: PPO | Source: Ambulatory Visit | Attending: Urology | Admitting: Urology

## 2019-07-04 DIAGNOSIS — Z905 Acquired absence of kidney: Secondary | ICD-10-CM | POA: Insufficient documentation

## 2019-07-04 DIAGNOSIS — Z87442 Personal history of urinary calculi: Secondary | ICD-10-CM | POA: Insufficient documentation

## 2019-07-04 DIAGNOSIS — N200 Calculus of kidney: Secondary | ICD-10-CM | POA: Diagnosis not present

## 2019-07-05 DIAGNOSIS — E7800 Pure hypercholesterolemia, unspecified: Secondary | ICD-10-CM | POA: Diagnosis not present

## 2019-07-05 DIAGNOSIS — N183 Chronic kidney disease, stage 3 (moderate): Secondary | ICD-10-CM | POA: Diagnosis not present

## 2019-07-07 ENCOUNTER — Ambulatory Visit
Admission: RE | Admit: 2019-07-07 | Discharge: 2019-07-07 | Disposition: A | Discharge status: Home / Self Care | Payer: PPO | Source: Ambulatory Visit | Attending: Urology | Admitting: Urology

## 2019-07-07 ENCOUNTER — Encounter: Payer: Self-pay | Admitting: Urology

## 2019-07-07 ENCOUNTER — Other Ambulatory Visit: Payer: Self-pay

## 2019-07-07 ENCOUNTER — Ambulatory Visit: Payer: PPO | Admitting: Urology

## 2019-07-07 VITALS — BP 116/78 | HR 84 | Wt 130.0 lb

## 2019-07-07 DIAGNOSIS — Z87442 Personal history of urinary calculi: Secondary | ICD-10-CM

## 2019-07-07 DIAGNOSIS — K3189 Other diseases of stomach and duodenum: Secondary | ICD-10-CM | POA: Diagnosis not present

## 2019-07-07 NOTE — Progress Notes (Signed)
   07/07/2019 5:12 PM   [name] [birth year] [id number]  Reason for visit: Follow up solitary kidney and history of nephrolithiasis  HPI: I had the pleasure of seeing [name] in urology clinic today for discussion of nephrolithiasis.  She is a 73 year old female with a history of a left radical nephrectomy for an AML in 2004, and she passed a stone on the right side in 2005.  She has had no other stones since that time.  She was previously managed on potassium citrate with Dr. Jacqlyn Larsen, however she stopped this medication secondary to cost.  She denies any flank pain at this time.    She also has a history of low-grade microscopic hematuria, and has previously deferred cystoscopy for further evaluation.  She denies any gross hematuria.  She denies any stone events since we saw her last.  KUB and ultrasound today do not show any hydronephrosis or stones.   ROS: Please see flowsheet from today's date for complete review of systems.  Physical Exam: BP 116/78   Pulse 84   Wt 130 lb (59 kg)   BMI 24.56 kg/m    Constitutional:  Alert and oriented, No acute distress. Respiratory: Normal respiratory effort, no increased work of breathing. GI: Abdomen is soft, nontender, nondistended, no abdominal masses GU: No CVA tenderness Skin: No rashes, bruises or suspicious lesions. Neurologic: Grossly intact, no focal deficits, moving all 4 extremities. Psychiatric: Normal mood and affect  Assessment & Plan:   In summary, she is a 73 year old female with a solitary kidney and history of nephrolithiasis.  No current stones on KUB and ultrasound today.  We discussed general stone prevention strategies including adequate hydration with goal of producing 2.5 L of urine daily, increasing citric acid intake, increasing calcium intake during high oxalate meals, minimizing animal protein, and decreasing salt intake. Information about dietary recommendations given today.   RTC 1 year for renal  ultrasound  A total of 15 minutes were spent face-to-face with the patient, greater than 50% was spent in patient education, counseling, and coordination of care regarding stone prevention.  Billey Co, [facility] [address], [place], [place] [address] [phone]

## 2019-07-26 DIAGNOSIS — N1832 Chronic kidney disease, stage 3b: Secondary | ICD-10-CM | POA: Diagnosis not present

## 2019-07-26 DIAGNOSIS — Z1211 Encounter for screening for malignant neoplasm of colon: Secondary | ICD-10-CM | POA: Diagnosis not present

## 2019-08-04 DIAGNOSIS — N183 Chronic kidney disease, stage 3 unspecified: Secondary | ICD-10-CM | POA: Diagnosis not present

## 2019-08-19 DIAGNOSIS — E871 Hypo-osmolality and hyponatremia: Secondary | ICD-10-CM | POA: Diagnosis not present

## 2019-10-25 DIAGNOSIS — B0052 Herpesviral keratitis: Secondary | ICD-10-CM | POA: Diagnosis not present

## 2019-11-10 ENCOUNTER — Other Ambulatory Visit: Payer: Self-pay | Admitting: Family Medicine

## 2019-11-10 DIAGNOSIS — Z1231 Encounter for screening mammogram for malignant neoplasm of breast: Secondary | ICD-10-CM

## 2019-11-15 DIAGNOSIS — D497 Neoplasm of unspecified behavior of endocrine glands and other parts of nervous system: Secondary | ICD-10-CM | POA: Diagnosis not present

## 2019-11-15 DIAGNOSIS — Z7189 Other specified counseling: Secondary | ICD-10-CM | POA: Diagnosis not present

## 2019-11-15 DIAGNOSIS — E2749 Other adrenocortical insufficiency: Secondary | ICD-10-CM | POA: Diagnosis not present

## 2019-11-15 DIAGNOSIS — E039 Hypothyroidism, unspecified: Secondary | ICD-10-CM | POA: Diagnosis not present

## 2019-11-15 DIAGNOSIS — E230 Hypopituitarism: Secondary | ICD-10-CM | POA: Diagnosis not present

## 2020-01-03 DIAGNOSIS — L570 Actinic keratosis: Secondary | ICD-10-CM | POA: Diagnosis not present

## 2020-01-05 DIAGNOSIS — E7800 Pure hypercholesterolemia, unspecified: Secondary | ICD-10-CM | POA: Diagnosis not present

## 2020-01-05 DIAGNOSIS — N1832 Chronic kidney disease, stage 3b: Secondary | ICD-10-CM | POA: Diagnosis not present

## 2020-01-12 DIAGNOSIS — N1832 Chronic kidney disease, stage 3b: Secondary | ICD-10-CM | POA: Diagnosis not present

## 2020-01-12 DIAGNOSIS — M810 Age-related osteoporosis without current pathological fracture: Secondary | ICD-10-CM | POA: Diagnosis not present

## 2020-01-12 DIAGNOSIS — E7800 Pure hypercholesterolemia, unspecified: Secondary | ICD-10-CM | POA: Diagnosis not present

## 2020-03-05 ENCOUNTER — Ambulatory Visit
Admission: RE | Admit: 2020-03-05 | Discharge: 2020-03-05 | Disposition: A | Discharge status: Home / Self Care | Payer: PPO | Source: Ambulatory Visit | Attending: Family Medicine | Admitting: Family Medicine

## 2020-03-05 DIAGNOSIS — L821 Other seborrheic keratosis: Secondary | ICD-10-CM | POA: Diagnosis not present

## 2020-03-05 DIAGNOSIS — Z85828 Personal history of other malignant neoplasm of skin: Secondary | ICD-10-CM | POA: Diagnosis not present

## 2020-03-05 DIAGNOSIS — D2262 Melanocytic nevi of left upper limb, including shoulder: Secondary | ICD-10-CM | POA: Diagnosis not present

## 2020-03-05 DIAGNOSIS — D1724 Benign lipomatous neoplasm of skin and subcutaneous tissue of left leg: Secondary | ICD-10-CM | POA: Diagnosis not present

## 2020-03-05 DIAGNOSIS — Z1231 Encounter for screening mammogram for malignant neoplasm of breast: Secondary | ICD-10-CM

## 2020-03-05 DIAGNOSIS — D2261 Melanocytic nevi of right upper limb, including shoulder: Secondary | ICD-10-CM | POA: Diagnosis not present

## 2020-03-05 DIAGNOSIS — D485 Neoplasm of uncertain behavior of skin: Secondary | ICD-10-CM | POA: Diagnosis not present

## 2020-03-05 DIAGNOSIS — D225 Melanocytic nevi of trunk: Secondary | ICD-10-CM | POA: Diagnosis not present

## 2020-04-20 ENCOUNTER — Encounter: Payer: Self-pay | Admitting: Urology

## 2020-04-24 ENCOUNTER — Other Ambulatory Visit: Payer: Self-pay

## 2020-04-24 ENCOUNTER — Other Ambulatory Visit
Admission: RE | Admit: 2020-04-24 | Discharge: 2020-04-24 | Disposition: A | Discharge status: Home / Self Care | Payer: PPO | Source: Ambulatory Visit | Attending: Internal Medicine | Admitting: Internal Medicine

## 2020-04-24 DIAGNOSIS — Z01812 Encounter for preprocedural laboratory examination: Secondary | ICD-10-CM | POA: Insufficient documentation

## 2020-04-24 DIAGNOSIS — Z20822 Contact with and (suspected) exposure to covid-19: Secondary | ICD-10-CM | POA: Diagnosis not present

## 2020-04-24 LAB — SARS CORONAVIRUS 2 (TAT 6-24 HRS): SARS Coronavirus 2: NEGATIVE

## 2020-04-25 ENCOUNTER — Encounter: Payer: Self-pay | Admitting: Internal Medicine

## 2020-04-26 ENCOUNTER — Encounter: Admission: RE | Payer: Self-pay | Source: Home / Self Care

## 2020-04-26 ENCOUNTER — Ambulatory Visit: Admission: RE | Admit: 2020-04-26 | Payer: PPO | Source: Home / Self Care | Admitting: Internal Medicine

## 2020-04-26 HISTORY — DX: Chronic kidney disease, unspecified: N18.9

## 2020-04-26 SURGERY — COLONOSCOPY WITH PROPOFOL
Anesthesia: General

## 2020-05-03 DIAGNOSIS — E2749 Other adrenocortical insufficiency: Secondary | ICD-10-CM | POA: Diagnosis not present

## 2020-05-03 DIAGNOSIS — E230 Hypopituitarism: Secondary | ICD-10-CM | POA: Diagnosis not present

## 2020-05-08 DIAGNOSIS — E2749 Other adrenocortical insufficiency: Secondary | ICD-10-CM | POA: Diagnosis not present

## 2020-05-08 DIAGNOSIS — D497 Neoplasm of unspecified behavior of endocrine glands and other parts of nervous system: Secondary | ICD-10-CM | POA: Diagnosis not present

## 2020-05-08 DIAGNOSIS — E039 Hypothyroidism, unspecified: Secondary | ICD-10-CM | POA: Diagnosis not present

## 2020-05-08 DIAGNOSIS — E230 Hypopituitarism: Secondary | ICD-10-CM | POA: Diagnosis not present

## 2020-06-28 ENCOUNTER — Ambulatory Visit
Admission: RE | Admit: 2020-06-28 | Discharge: 2020-06-28 | Disposition: A | Discharge status: Home / Self Care | Payer: PPO | Source: Ambulatory Visit | Attending: Urology | Admitting: Urology

## 2020-06-28 ENCOUNTER — Other Ambulatory Visit: Payer: Self-pay

## 2020-06-28 DIAGNOSIS — Z87442 Personal history of urinary calculi: Secondary | ICD-10-CM | POA: Insufficient documentation

## 2020-06-28 DIAGNOSIS — Z905 Acquired absence of kidney: Secondary | ICD-10-CM | POA: Insufficient documentation

## 2020-06-28 DIAGNOSIS — N200 Calculus of kidney: Secondary | ICD-10-CM | POA: Diagnosis not present

## 2020-07-05 ENCOUNTER — Ambulatory Visit: Payer: PPO

## 2020-07-09 ENCOUNTER — Ambulatory Visit: Payer: PPO | Admitting: Urology

## 2020-07-11 ENCOUNTER — Ambulatory Visit: Payer: PPO | Admitting: Urology

## 2020-07-18 ENCOUNTER — Encounter: Payer: Self-pay | Admitting: Urology

## 2020-07-18 ENCOUNTER — Ambulatory Visit: Payer: PPO | Admitting: Urology

## 2020-07-18 ENCOUNTER — Other Ambulatory Visit: Payer: Self-pay

## 2020-07-18 VITALS — BP 127/80 | HR 61 | Ht 61.0 in | Wt 124.0 lb

## 2020-07-18 DIAGNOSIS — N200 Calculus of kidney: Secondary | ICD-10-CM | POA: Diagnosis not present

## 2020-07-18 DIAGNOSIS — R3121 Asymptomatic microscopic hematuria: Secondary | ICD-10-CM

## 2020-07-18 DIAGNOSIS — N1832 Chronic kidney disease, stage 3b: Secondary | ICD-10-CM | POA: Diagnosis not present

## 2020-07-18 DIAGNOSIS — Z905 Acquired absence of kidney: Secondary | ICD-10-CM

## 2020-07-18 DIAGNOSIS — Z87442 Personal history of urinary calculi: Secondary | ICD-10-CM | POA: Diagnosis not present

## 2020-07-18 DIAGNOSIS — E7800 Pure hypercholesterolemia, unspecified: Secondary | ICD-10-CM | POA: Diagnosis not present

## 2020-07-18 NOTE — Progress Notes (Signed)
   07/18/2020 9:18 AM   [name] May 16, 1946 [id number]  Reason for visit: Follow up solitary kidney, nephrolithiasis, history of microscopic hematuria  HPI: I saw Ms. Payson back in urology clinic for the above issues.  She is a 74 year old female with a history of a left radical nephrectomy for an AML in 2004, as well as a history of right-sided nephrolithiasis in 2005.  She had no stones since that time.  She also has a 20+ year history of low-grade microscopic hematuria, and has deferred further evaluation with cystoscopy and CT urogram multiple times.  She denies any problems over the last year, specifically any gross hematuria, flank pain, or stone events.  Her husband Mr. Parady, recently underwent a HoLEP and is doing very well.  I personally reviewed her renal ultrasound today which shows a solitary right kidney with no evidence of hydronephrosis or nephrolithiasis.  We again discussed the AUA guidelines regarding microscopic hematuria and work-up options, and she again defers work-up.  This is not unreasonable with her very long history of low-grade microscopic hematuria, but we discussed the low risk of missing clinically significant pathology.  She would like to continue yearly renal ultrasounds for surveillance in the setting of her solitary kidney and history of kidney stones.  RTC 1 year with renal ultrasound prior   Billey Co, MD  [facility] [address] [place], [place] [address] [phone]

## 2020-07-18 NOTE — Patient Instructions (Signed)
Dietary Guidelines to Help Prevent Kidney Stones Kidney stones are deposits of minerals and salts that form inside your kidneys. Your risk of developing kidney stones may be greater depending on your diet, your lifestyle, the medicines you take, and whether you have certain medical conditions. Most people can reduce their chances of developing kidney stones by following the instructions below. Depending on your overall health and the type of kidney stones you tend to develop, your dietitian may give you more specific instructions. What are tips for following this plan? Reading food labels  Choose foods with "no salt added" or "low-salt" labels. Limit your sodium intake to less than 1500 mg per day.  Choose foods with calcium for each meal and snack. Try to eat about 300 mg of calcium at each meal. Foods that contain 200-500 mg of calcium per serving include: ? 8 oz (237 ml) of milk, fortified nondairy milk, and fortified fruit juice. ? 8 oz (237 ml) of kefir, yogurt, and soy yogurt. ? 4 oz (118 ml) of tofu. ? 1 oz of cheese. ? 1 cup (300 g) of dried figs. ? 1 cup (91 g) of cooked broccoli. ? 1-3 oz can of sardines or mackerel.  Most people need 1000 to 1500 mg of calcium each day. Talk to your dietitian about how much calcium is recommended for you. Shopping  Buy plenty of fresh fruits and vegetables. Most people do not need to avoid fruits and vegetables, even if they contain nutrients that may contribute to kidney stones.  When shopping for convenience foods, choose: ? Whole pieces of fruit. ? Premade salads with dressing on the side. ? Low-fat fruit and yogurt smoothies.  Avoid buying frozen meals or prepared deli foods.  Look for foods with live cultures, such as yogurt and kefir. Cooking  Do not add salt to food when cooking. Place a salt shaker on the table and allow each person to add his or her own salt to taste.  Use vegetable protein, such as beans, textured vegetable  protein (TVP), or tofu instead of meat in pasta, casseroles, and soups. Meal planning   Eat less salt, if told by your dietitian. To do this: ? Avoid eating processed or premade food. ? Avoid eating fast food.  Eat less animal protein, including cheese, meat, poultry, or fish, if told by your dietitian. To do this: ? Limit the number of times you have meat, poultry, fish, or cheese each week. Eat a diet free of meat at least 2 days a week. ? Eat only one serving each day of meat, poultry, fish, or seafood. ? When you prepare animal protein, cut pieces into small portion sizes. For most meat and fish, one serving is about the size of one deck of cards.  Eat at least 5 servings of fresh fruits and vegetables each day. To do this: ? Keep fruits and vegetables on hand for snacks. ? Eat 1 piece of fruit or a handful of berries with breakfast. ? Have a salad and fruit at lunch. ? Have two kinds of vegetables at dinner.  Limit foods that are high in a substance called oxalate. These include: ? Spinach. ? Rhubarb. ? Beets. ? Potato chips and french fries. ? Nuts.  If you regularly take a diuretic medicine, make sure to eat at least 1-2 fruits or vegetables high in potassium each day. These include: ? Avocado. ? Banana. ? Orange, prune, carrot, or tomato juice. ? Baked potato. ? Cabbage. ? Beans and split   peas. General instructions   Drink enough fluid to keep your urine clear or pale yellow. This is the most important thing you can do.  Talk to your health care provider and dietitian about taking daily supplements. Depending on your health and the cause of your kidney stones, you may be advised: ? Not to take supplements with vitamin C. ? To take a calcium supplement. ? To take a daily probiotic supplement. ? To take other supplements such as magnesium, fish oil, or vitamin B6.  Take all medicines and supplements as told by your health care provider.  Limit alcohol intake to no  more than 1 drink a day for nonpregnant women and 2 drinks a day for men. One drink equals 12 oz of beer, 5 oz of wine, or 1 oz of hard liquor.  Lose weight if told by your health care provider. Work with your dietitian to find strategies and an eating plan that works best for you. What foods are not recommended? Limit your intake of the following foods, or as told by your dietitian. Talk to your dietitian about specific foods you should avoid based on the type of kidney stones and your overall health. Grains Breads. Bagels. Rolls. Baked goods. Salted crackers. Cereal. Pasta. Vegetables Spinach. Rhubarb. Beets. Canned vegetables. Pickles. Olives. Meats and other protein foods Nuts. Nut butters. Large portions of meat, poultry, or fish. Salted or cured meats. Deli meats. Hot dogs. Sausages. Dairy Cheese. Beverages Regular soft drinks. Regular vegetable juice. Seasonings and other foods Seasoning blends with salt. Salad dressings. Canned soups. Soy sauce. Ketchup. Barbecue sauce. Canned pasta sauce. Casseroles. Pizza. Lasagna. Frozen meals. Potato chips. French fries. Summary  You can reduce your risk of kidney stones by making changes to your diet.  The most important thing you can do is drink enough fluid. You should drink enough fluid to keep your urine clear or pale yellow.  Ask your health care provider or dietitian how much protein from animal sources you should eat each day, and also how much salt and calcium you should have each day. This information is not intended to replace advice given to you by your health care provider. Make sure you discuss any questions you have with your health care provider. Document Revised: 01/19/2019 Document Reviewed: 09/09/2016 Elsevier Patient Education  2020 Elsevier Inc.  

## 2020-07-24 DIAGNOSIS — E7800 Pure hypercholesterolemia, unspecified: Secondary | ICD-10-CM | POA: Diagnosis not present

## 2020-07-24 DIAGNOSIS — N1831 Chronic kidney disease, stage 3a: Secondary | ICD-10-CM | POA: Diagnosis not present

## 2020-07-24 DIAGNOSIS — Z0000 Encounter for general adult medical examination without abnormal findings: Secondary | ICD-10-CM | POA: Diagnosis not present

## 2020-07-24 DIAGNOSIS — E871 Hypo-osmolality and hyponatremia: Secondary | ICD-10-CM | POA: Diagnosis not present

## 2020-07-31 DIAGNOSIS — E871 Hypo-osmolality and hyponatremia: Secondary | ICD-10-CM | POA: Diagnosis not present

## 2020-08-09 DIAGNOSIS — K5730 Diverticulosis of large intestine without perforation or abscess without bleeding: Secondary | ICD-10-CM | POA: Diagnosis not present

## 2020-08-09 DIAGNOSIS — N1831 Chronic kidney disease, stage 3a: Secondary | ICD-10-CM | POA: Diagnosis not present

## 2020-08-09 DIAGNOSIS — Z1211 Encounter for screening for malignant neoplasm of colon: Secondary | ICD-10-CM | POA: Diagnosis not present

## 2020-09-17 DIAGNOSIS — D485 Neoplasm of uncertain behavior of skin: Secondary | ICD-10-CM | POA: Diagnosis not present

## 2020-09-17 DIAGNOSIS — D0461 Carcinoma in situ of skin of right upper limb, including shoulder: Secondary | ICD-10-CM | POA: Diagnosis not present

## 2020-09-24 DIAGNOSIS — H179 Unspecified corneal scar and opacity: Secondary | ICD-10-CM | POA: Diagnosis not present

## 2020-10-19 DIAGNOSIS — Z01818 Encounter for other preprocedural examination: Secondary | ICD-10-CM | POA: Diagnosis not present

## 2020-10-23 DIAGNOSIS — K641 Second degree hemorrhoids: Secondary | ICD-10-CM | POA: Diagnosis not present

## 2020-10-23 DIAGNOSIS — Z1211 Encounter for screening for malignant neoplasm of colon: Secondary | ICD-10-CM | POA: Diagnosis not present

## 2020-10-23 DIAGNOSIS — K5730 Diverticulosis of large intestine without perforation or abscess without bleeding: Secondary | ICD-10-CM | POA: Diagnosis not present

## 2020-10-31 DIAGNOSIS — D0461 Carcinoma in situ of skin of right upper limb, including shoulder: Secondary | ICD-10-CM | POA: Diagnosis not present

## 2020-10-31 DIAGNOSIS — C44622 Squamous cell carcinoma of skin of right upper limb, including shoulder: Secondary | ICD-10-CM | POA: Diagnosis not present

## 2020-11-01 ENCOUNTER — Encounter: Payer: Self-pay | Admitting: Urology

## 2020-11-01 ENCOUNTER — Ambulatory Visit: Payer: PPO | Admitting: Urology

## 2020-11-01 ENCOUNTER — Other Ambulatory Visit: Payer: Self-pay

## 2020-11-01 VITALS — BP 129/82 | HR 90 | Ht 61.0 in | Wt 121.0 lb

## 2020-11-01 DIAGNOSIS — R300 Dysuria: Secondary | ICD-10-CM | POA: Diagnosis not present

## 2020-11-01 LAB — URINALYSIS, COMPLETE
Bilirubin, UA: NEGATIVE
Glucose, UA: NEGATIVE
Ketones, UA: NEGATIVE
Nitrite, UA: NEGATIVE
Specific Gravity, UA: 1.015 (ref 1.005–1.030)
Urobilinogen, Ur: 0.2 mg/dL (ref 0.2–1.0)
pH, UA: 6 (ref 5.0–7.5)

## 2020-11-01 LAB — BLADDER SCAN AMB NON-IMAGING: Scan Result: 0

## 2020-11-01 LAB — MICROSCOPIC EXAMINATION

## 2020-11-01 MED ORDER — SULFAMETHOXAZOLE-TRIMETHOPRIM 800-160 MG PO TABS: 1.0000 | ORAL_TABLET | Freq: Two times a day (BID) | ORAL | 0 refills | Status: DC

## 2020-11-01 NOTE — Progress Notes (Signed)
11/01/2020 4:02 PM   Brianna Hammond 12-Nov-1945 [id number]  Referring provider: Dion Body, MD [facility] [place],  [place] [address]  Chief Complaint  Patient presents with  . Recurrent UTI   Urological history 1. Solitary kidney - left radical nephrectomy for an AML in 2004 - RUS 06/2020 Normal ultrasound appearance of the right kidney  2. Nephrolithiasis - no stones seen on 06/2020 RUS - scheduled for repeat RUS in 07/2021  3. High risk hematuria - nonsmoker - 20 + year history of low grade micro heme - deferred CT urogram and cystoscopy multiple times - UA today positive for micro heme, but likely secondary to infection   HPI: Brianna Hammond is a 75 y.o. female who presents today for an urgent appointment for a possible UTI.  She underwent a colonoscopy on 10/23/2020 and had significant diarrhea due to the prep.  Last night, she started to experience burning with urination and tightness in her lower pelvic area.  Patient denies any modifying or aggravating factors.  Patient denies any gross hematuria or flank pain.  Patient denies any fevers, chills, nausea or vomiting.   UA positive for 11-30 WBC's, 11-30 RBC's and moderate bacteria.  PVR is 0 mL.    PMH: Past Medical History:  Diagnosis Date  . Acquired hypothyroidism 04/04/2016  . Age-related osteoporosis without current pathological fracture 09/29/2016  . Arthritis   . Benign neoplasm of pituitary gland and craniopharyngeal duct ([place]) 11/15/2012  . Calculus of kidney 11/15/2012  . Chronic renal failure   . Fibrocystic breast disease   . GERD (gastroesophageal reflux disease)   . Hepatitis A    age 89  . History of kidney stones   . History of pituitary tumor 08/14/2017   Overview:  s/p removal  . History of radiation therapy 1/25, 1/27, 1/29, 2/2, 11/15/14   SRS pituitary adenoma 5 Gy in 5 fx, total 25 Gy  . Hypothyroid   . Infection of urinary tract 11/15/2012  .  Kidney calculus   . Low serum cortisol level ([place]) 09/10/2016  . Microscopic hematuria 11/15/2012  . Morgagni hernia   . 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 Osteoporosis   . Pituitary adenoma ([place]) 2005  . Pituitary adenoma ([place]) 10/30/2014  . Pure hypercholesterolemia 04/04/2016  . Small kidney, unilateral 12/20/2014  . TMJ syndrome   . Trigeminal herpes zoster   . Vaccine counseling 09/10/2016    Surgical History: Past Surgical History:  Procedure Laterality Date  . BRAIN SURGERY    . BREAST BIOPSY Left 1986   neg  . COLONOSCOPY    . HERNIA REPAIR     morgagnia  . HIATAL HERNIA REPAIR N/A 05/01/2016   Procedure: LAPAROSCOPIC REPAIR OF MORGAGNI HERNIA;  Surgeon: Jules Husbands, MD;  Location: [facility] ORS;  Service: General;  Laterality: N/A;  . INGUINAL HERNIA REPAIR Bilateral 10/28/2017   Procedure: LAPAROSCOPIC BILATERAL INGUINAL HERNIA REPAIR;  Surgeon: Jules Husbands, MD;  Location: [facility] ORS;  Service: General;  Laterality: Bilateral;  . NEPHRECTOMY RADICAL Left 08/2003   for metanephric adenoma  . TONSILLECTOMY     age 84  . TRANSPHENOIDAL PITUITARY RESECTION  03/14/2004   infarcted pituitary tumor with features most suggestive of pituitary adenoma    Home Medications:  Allergies as of 11/01/2020      Reactions   Dilaudid [hydromorphone Hcl] Anaphylaxis   Apnea/ required narcan in pacu to reverse   Dexamethasone Sodium  Phosphate Hives   Ezetimibe Other (See Comments)   Muscle pain    Decadron [dexamethasone] Other (See Comments)   Flushing, hives   Latex Rash   And blisters      Medication List       Accurate as of November 01, 2020  4:02 PM. If you have any questions, ask your nurse or doctor.        calcium carbonate 500 MG chewable tablet Commonly known as: TUMS - dosed in mg elemental calcium Chew 1 tablet by mouth daily as needed for indigestion or heartburn.   CALCIUM CITRATE + D3 PO Take 1 tablet by mouth daily.   hydrocortisone 5  MG tablet Commonly known as: CORTEF Take 5 mg by mouth daily.   levothyroxine 50 MCG tablet Commonly known as: SYNTHROID Take 50 mcg by mouth daily.   potassium citrate 10 MEQ (1080 MG) SR tablet Commonly known as: UROCIT-K Take 10 mEq by mouth daily.   pravastatin 20 MG tablet Commonly known as: PRAVACHOL Take 20 mg by mouth daily.   sulfamethoxazole-trimethoprim 800-160 MG tablet Commonly known as: BACTRIM DS Take 1 tablet by mouth every 12 (twelve) hours. Started by: Zara Council, PA-C   vitamin B-12 1000 MCG tablet Commonly known as: CYANOCOBALAMIN Take 1,000 mcg by mouth daily.       Allergies:  Allergies  Allergen Reactions  . Dilaudid [Hydromorphone Hcl] Anaphylaxis    Apnea/ required narcan in pacu to reverse  . Dexamethasone Sodium Phosphate Hives  . Ezetimibe Other (See Comments)    Muscle pain   . Decadron [Dexamethasone] Other (See Comments)    Flushing, hives  . Latex Rash    And blisters    Family History: Family History  Problem Relation Age of Onset  . Cancer Mother        liver  . Lymphoma Mother   . Breast cancer Neg Hx     Social History:  reports that she has never smoked. She has never used smokeless tobacco. She reports current alcohol use. She reports that she does not use drugs.  ROS: Pertinent ROS in HPI  Physical Exam: BP 129/82   Pulse 90   Ht 5\' 1"  (1.549 m)   Wt 121 lb (54.9 kg)   BMI 22.86 kg/m   Constitutional:  Well nourished. Alert and oriented, No acute distress. HEENT: [place] AT, mask in place.  Trachea midline Cardiovascular: No clubbing, cyanosis, or edema. Respiratory: Normal respiratory effort, no increased work of breathing. Neurologic: Grossly intact, no focal deficits, moving all 4 extremities. Psychiatric: Normal mood and affect.  Laboratory Data: Urinalysis Component     Latest Ref Rng & Units 11/01/2020  Specific Gravity, UA     1.005 - 1.030 1.015  pH, UA     5.0 - 7.5 6.0  Color, UA     Yellow  Yellow  Appearance Ur     Clear Cloudy (A)  Leukocytes,UA     Negative 1+ (A)  Protein,UA     Negative/Trace Trace (A)  Glucose, UA     Negative Negative  Ketones, UA     Negative Negative  RBC, UA     Negative 2+ (A)  Bilirubin, UA     Negative Negative  Urobilinogen, Ur     0.2 - 1.0 mg/dL 0.2  Nitrite, UA     Negative Negative  Microscopic Examination      See below:   Component     Latest Ref Rng & Units 11/01/2020  WBC, UA     0 - 5 /hpf 11-30 (A)  RBC     0 - 2 /hpf 11-30 (A)  Epithelial Cells (non renal)     0 - 10 /hpf 0-10  Bacteria, UA     None seen/Few Moderate (A)    I have reviewed the labs.   Pertinent Imaging: Results for SUZANN, LAZARO" (MRN [id number]) as of 11/01/2020 15:58  Ref. Range 11/01/2020 14:15  Scan Result Unknown 0    Assessment & Plan:    1. Dysuria - Urinalysis, Complete - CULTURE, URINE COMPREHENSIVE - BLADDER SCAN AMB NON-IMAGING - likely secondary to UTI - start Septra DS empirically, urine sent for culture - will adjust if necessary once culture results are available    Return for Keep follow up with Dr. Diamantina Providence in October .  These notes generated with voice recognition software. I apologize for typographical errors.  Zara Council, PA-C  [facility] [address]  [place], [place] [address] [phone]

## 2020-11-08 LAB — CULTURE, URINE COMPREHENSIVE

## 2020-11-21 ENCOUNTER — Telehealth: Payer: Self-pay | Admitting: Urology

## 2020-11-21 NOTE — Telephone Encounter (Signed)
Please have Brianna Hammond drop off a urinalysis to we can be sure her UTI has cleared.

## 2020-11-22 ENCOUNTER — Other Ambulatory Visit: Payer: Self-pay

## 2020-11-22 ENCOUNTER — Other Ambulatory Visit: Payer: PPO

## 2020-11-22 ENCOUNTER — Other Ambulatory Visit: Payer: Self-pay | Admitting: Family Medicine

## 2020-11-22 ENCOUNTER — Telehealth: Payer: Self-pay | Admitting: Family Medicine

## 2020-11-22 DIAGNOSIS — R300 Dysuria: Secondary | ICD-10-CM

## 2020-11-22 DIAGNOSIS — R3121 Asymptomatic microscopic hematuria: Secondary | ICD-10-CM

## 2020-11-22 LAB — MICROSCOPIC EXAMINATION
Bacteria, UA: NONE SEEN
WBC, UA: NONE SEEN /hpf (ref 0–5)

## 2020-11-22 LAB — URINALYSIS, COMPLETE
Bilirubin, UA: NEGATIVE
Glucose, UA: NEGATIVE
Ketones, UA: NEGATIVE
Leukocytes,UA: NEGATIVE
Nitrite, UA: NEGATIVE
Protein,UA: NEGATIVE
Specific Gravity, UA: 1.02 (ref 1.005–1.030)
Urobilinogen, Ur: 0.2 mg/dL (ref 0.2–1.0)
pH, UA: 5 (ref 5.0–7.5)

## 2020-11-22 NOTE — Telephone Encounter (Signed)
-----   Message from Nori Riis, PA-C sent at 11/22/2020  1:55 PM EST ----- Please let Mrs. Bisig know that her urine has returned to baseline.

## 2020-11-22 NOTE — Telephone Encounter (Signed)
Patient notified and scheduled appointment for a UA drop off.

## 2020-11-22 NOTE — Telephone Encounter (Signed)
Patient notified and voiced understanding.

## 2020-11-23 ENCOUNTER — Other Ambulatory Visit: Payer: Self-pay

## 2020-11-28 DIAGNOSIS — E230 Hypopituitarism: Secondary | ICD-10-CM | POA: Diagnosis not present

## 2020-11-28 DIAGNOSIS — E2749 Other adrenocortical insufficiency: Secondary | ICD-10-CM | POA: Diagnosis not present

## 2020-11-28 DIAGNOSIS — E039 Hypothyroidism, unspecified: Secondary | ICD-10-CM | POA: Diagnosis not present

## 2020-11-28 DIAGNOSIS — D497 Neoplasm of unspecified behavior of endocrine glands and other parts of nervous system: Secondary | ICD-10-CM | POA: Diagnosis not present

## 2021-01-29 DIAGNOSIS — E7800 Pure hypercholesterolemia, unspecified: Secondary | ICD-10-CM | POA: Diagnosis not present

## 2021-01-29 DIAGNOSIS — E039 Hypothyroidism, unspecified: Secondary | ICD-10-CM | POA: Diagnosis not present

## 2021-01-29 DIAGNOSIS — N1831 Chronic kidney disease, stage 3a: Secondary | ICD-10-CM | POA: Diagnosis not present

## 2021-02-05 DIAGNOSIS — E039 Hypothyroidism, unspecified: Secondary | ICD-10-CM | POA: Diagnosis not present

## 2021-02-05 DIAGNOSIS — Z87898 Personal history of other specified conditions: Secondary | ICD-10-CM | POA: Diagnosis not present

## 2021-02-05 DIAGNOSIS — N1831 Chronic kidney disease, stage 3a: Secondary | ICD-10-CM | POA: Diagnosis not present

## 2021-02-05 DIAGNOSIS — M810 Age-related osteoporosis without current pathological fracture: Secondary | ICD-10-CM | POA: Diagnosis not present

## 2021-02-05 DIAGNOSIS — E2740 Unspecified adrenocortical insufficiency: Secondary | ICD-10-CM | POA: Diagnosis not present

## 2021-02-05 DIAGNOSIS — E7800 Pure hypercholesterolemia, unspecified: Secondary | ICD-10-CM | POA: Diagnosis not present

## 2021-03-05 ENCOUNTER — Other Ambulatory Visit: Payer: Self-pay

## 2021-03-05 ENCOUNTER — Emergency Department: Payer: PPO

## 2021-03-05 ENCOUNTER — Emergency Department
Admission: EM | Admit: 2021-03-05 | Discharge: 2021-03-05 | Disposition: A | Discharge status: Home / Self Care | Payer: PPO | Source: Home / Self Care | Attending: Emergency Medicine | Admitting: Emergency Medicine

## 2021-03-05 DIAGNOSIS — R197 Diarrhea, unspecified: Secondary | ICD-10-CM

## 2021-03-05 DIAGNOSIS — Z91040 Latex allergy status: Secondary | ICD-10-CM | POA: Diagnosis not present

## 2021-03-05 DIAGNOSIS — Z8559 Personal history of malignant neoplasm of other urinary tract organ: Secondary | ICD-10-CM | POA: Insufficient documentation

## 2021-03-05 DIAGNOSIS — E039 Hypothyroidism, unspecified: Secondary | ICD-10-CM | POA: Insufficient documentation

## 2021-03-05 DIAGNOSIS — D352 Benign neoplasm of pituitary gland: Secondary | ICD-10-CM | POA: Diagnosis present

## 2021-03-05 DIAGNOSIS — N183 Chronic kidney disease, stage 3 unspecified: Secondary | ICD-10-CM | POA: Insufficient documentation

## 2021-03-05 DIAGNOSIS — R112 Nausea with vomiting, unspecified: Secondary | ICD-10-CM

## 2021-03-05 DIAGNOSIS — E860 Dehydration: Secondary | ICD-10-CM

## 2021-03-05 DIAGNOSIS — U071 COVID-19: Secondary | ICD-10-CM

## 2021-03-05 DIAGNOSIS — R000 Tachycardia, unspecified: Secondary | ICD-10-CM | POA: Diagnosis not present

## 2021-03-05 DIAGNOSIS — R680 Hypothermia, not associated with low environmental temperature: Secondary | ICD-10-CM | POA: Diagnosis present

## 2021-03-05 DIAGNOSIS — Z79890 Hormone replacement therapy (postmenopausal): Secondary | ICD-10-CM | POA: Diagnosis not present

## 2021-03-05 DIAGNOSIS — T68XXXA Hypothermia, initial encounter: Secondary | ICD-10-CM | POA: Diagnosis not present

## 2021-03-05 DIAGNOSIS — A419 Sepsis, unspecified organism: Secondary | ICD-10-CM | POA: Diagnosis not present

## 2021-03-05 DIAGNOSIS — Z885 Allergy status to narcotic agent status: Secondary | ICD-10-CM | POA: Diagnosis not present

## 2021-03-05 DIAGNOSIS — E785 Hyperlipidemia, unspecified: Secondary | ICD-10-CM | POA: Diagnosis present

## 2021-03-05 DIAGNOSIS — Z79899 Other long term (current) drug therapy: Secondary | ICD-10-CM | POA: Diagnosis not present

## 2021-03-05 DIAGNOSIS — T68XXXD Hypothermia, subsequent encounter: Secondary | ICD-10-CM | POA: Diagnosis not present

## 2021-03-05 DIAGNOSIS — Z888 Allergy status to other drugs, medicaments and biological substances status: Secondary | ICD-10-CM | POA: Diagnosis not present

## 2021-03-05 DIAGNOSIS — E7800 Pure hypercholesterolemia, unspecified: Secondary | ICD-10-CM | POA: Diagnosis present

## 2021-03-05 DIAGNOSIS — R3129 Other microscopic hematuria: Secondary | ICD-10-CM | POA: Diagnosis not present

## 2021-03-05 DIAGNOSIS — Z905 Acquired absence of kidney: Secondary | ICD-10-CM | POA: Diagnosis not present

## 2021-03-05 DIAGNOSIS — R059 Cough, unspecified: Secondary | ICD-10-CM | POA: Diagnosis present

## 2021-03-05 DIAGNOSIS — E2740 Unspecified adrenocortical insufficiency: Secondary | ICD-10-CM | POA: Diagnosis not present

## 2021-03-05 DIAGNOSIS — N1831 Chronic kidney disease, stage 3a: Secondary | ICD-10-CM | POA: Diagnosis not present

## 2021-03-05 LAB — URINALYSIS, COMPLETE (UACMP) WITH MICROSCOPIC
Bilirubin Urine: NEGATIVE
Glucose, UA: NEGATIVE mg/dL
Ketones, ur: 5 mg/dL — AB
Nitrite: NEGATIVE
Protein, ur: NEGATIVE mg/dL
Specific Gravity, Urine: 1.017 (ref 1.005–1.030)
pH: 5 (ref 5.0–8.0)

## 2021-03-05 LAB — COMPREHENSIVE METABOLIC PANEL
ALT: 10 U/L (ref 0–44)
AST: 25 U/L (ref 15–41)
Albumin: 3.8 g/dL (ref 3.5–5.0)
Alkaline Phosphatase: 45 U/L (ref 38–126)
Anion gap: 13 (ref 5–15)
BUN: 39 mg/dL — ABNORMAL HIGH (ref 8–23)
CO2: 20 mmol/L — ABNORMAL LOW (ref 22–32)
Calcium: 8.6 mg/dL — ABNORMAL LOW (ref 8.9–10.3)
Chloride: 97 mmol/L — ABNORMAL LOW (ref 98–111)
Creatinine, Ser: 1.59 mg/dL — ABNORMAL HIGH (ref 0.44–1.00)
GFR, Estimated: 34 mL/min — ABNORMAL LOW (ref >60)
Glucose, Bld: 71 mg/dL (ref 70–99)
Potassium: 5.2 mmol/L — ABNORMAL HIGH (ref 3.5–5.1)
Sodium: 130 mmol/L — ABNORMAL LOW (ref 135–145)
Total Bilirubin: 1.1 mg/dL (ref 0.3–1.2)
Total Protein: 7.1 g/dL (ref 6.5–8.1)

## 2021-03-05 LAB — CBC
HCT: 36.5 % (ref 36.0–46.0)
Hemoglobin: 12.5 g/dL (ref 12.0–15.0)
MCH: 27.5 pg (ref 26.0–34.0)
MCHC: 34.2 g/dL (ref 30.0–36.0)
MCV: 80.4 fL (ref 80.0–100.0)
Platelets: 202 10*3/uL (ref 150–400)
RBC: 4.54 MIL/uL (ref 3.87–5.11)
RDW: 13.2 % (ref 11.5–15.5)
WBC: 5.7 10*3/uL (ref 4.0–10.5)
nRBC: 0 % (ref 0.0–0.2)

## 2021-03-05 LAB — LIPASE, BLOOD: Lipase: 31 U/L (ref 11–51)

## 2021-03-05 LAB — RESP PANEL BY RT-PCR (FLU A&B, COVID) ARPGX2
Influenza A by PCR: NEGATIVE
Influenza B by PCR: NEGATIVE
SARS Coronavirus 2 by RT PCR: POSITIVE — AB

## 2021-03-05 MED ORDER — ONDANSETRON HCL 4 MG/2ML IJ SOLN
4.0000 mg | Freq: Once | INTRAMUSCULAR | Status: AC
  Administered 2021-03-05: 4 mg via INTRAVENOUS
  Filled 2021-03-05: qty 2

## 2021-03-05 MED ORDER — LACTATED RINGERS IV BOLUS
1000.0000 mL | Freq: Once | INTRAVENOUS | Status: AC
  Administered 2021-03-05: 1000 mL via INTRAVENOUS

## 2021-03-05 MED ORDER — ONDANSETRON 4 MG PO TBDP: 4.0000 mg | ORAL_TABLET | Freq: Three times a day (TID) | ORAL | 0 refills | Status: DC | PRN

## 2021-03-05 NOTE — ED Notes (Signed)
Patient able to tolerate ice chips and saltine crackers.

## 2021-03-05 NOTE — ED Provider Notes (Signed)
[facility] Emergency Department Provider Note   ____________________________________________   Event Date/Time   First MD Initiated Contact with Patient 03/05/21 1227     (approximate)  I have reviewed the triage vital signs and the nursing notes.   HISTORY  Chief Complaint Emesis and Cough    HPI Brianna Hammond is a 75 y.o. female with past medical history of hyperlipidemia, CKD, and hypothyroidism who presents to the ED complaining of nausea and vomiting.  Patient reports that 4 days ago she developed vomiting and diarrhea, states that diarrhea improved after taking Imodium but she continues to vomit.  She states she cannot tolerate any liquids or solids, frequently vomits after trying water or ginger ale.  She feels sore in her abdomen from vomiting on multiple occasions, but denies abdominal pain between episodes of vomiting.  She has not noticed any blood in her vomit or stool.  She denies any fevers but has had a cough, denies chest pain or shortness of breath.  She is concerned that she could be dehydrated.        Past Medical History:  Diagnosis Date  . Acquired hypothyroidism 04/04/2016  . Age-related osteoporosis without current pathological fracture 09/29/2016  . Arthritis   . Benign neoplasm of pituitary gland and craniopharyngeal duct ([place]) 11/15/2012  . Calculus of kidney 11/15/2012  . Chronic renal failure   . Fibrocystic breast disease   . GERD (gastroesophageal reflux disease)   . Hepatitis A    age 27  . History of kidney stones   . History of pituitary tumor 08/14/2017   Overview:  s/p removal  . History of radiation therapy 1/25, 1/27, 1/29, 2/2, 11/15/14   SRS pituitary adenoma 5 Gy in 5 fx, total 25 Gy  . Hypothyroid   . Infection of urinary tract 11/15/2012  . Kidney calculus   . Low serum cortisol level ([place]) 09/10/2016  . Microscopic hematuria 11/15/2012  . Morgagni hernia   . 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 Osteoporosis   . Pituitary adenoma ([place]) 2005  . Pituitary adenoma ([place]) 10/30/2014  . Pure hypercholesterolemia 04/04/2016  . Small kidney, unilateral 12/20/2014  . TMJ syndrome   . Trigeminal herpes zoster   . Vaccine counseling 09/10/2016    Patient Active Problem List   Diagnosis Date Noted  . Non-recurrent inguinal hernia without obstruction or gangrene   . CKD (chronic kidney disease) stage 3, GFR 30-59 ml/min (HCC) 10/26/2017  . History of pituitary tumor 08/14/2017  . Age-related osteoporosis without current pathological fracture 09/29/2016  . Low serum cortisol level ([place]) 09/10/2016  . Encounter for general adult medical examination without abnormal findings 09/10/2016  . Morgagni hernia   . Pure hypercholesterolemia 04/04/2016  . Acquired hypothyroidism 04/04/2016  . Small kidney, unilateral 12/20/2014  . Pituitary adenoma ([place]) 10/30/2014  . Infection of urinary tract 11/15/2012  . 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 Microscopic hematuria 11/15/2012  . Calculus of kidney 11/15/2012  . Benign neoplasm of pituitary gland and craniopharyngeal duct ([place]) 11/15/2012    Past Surgical History:  Procedure Laterality Date  . BRAIN SURGERY    . BREAST BIOPSY Left 1986   neg  . COLONOSCOPY    . HERNIA REPAIR     morgagnia  . HIATAL HERNIA REPAIR N/A 05/01/2016   Procedure: LAPAROSCOPIC REPAIR OF MORGAGNI HERNIA;  Surgeon: Jules Husbands, MD;  Location: [facility] ORS;  Service: General;  Laterality: N/A;  .  INGUINAL HERNIA REPAIR Bilateral 10/28/2017   Procedure: LAPAROSCOPIC BILATERAL INGUINAL HERNIA REPAIR;  Surgeon: Jules Husbands, MD;  Location: [facility] ORS;  Service: General;  Laterality: Bilateral;  . NEPHRECTOMY RADICAL Left 08/2003   for metanephric adenoma  . TONSILLECTOMY     age 3  . TRANSPHENOIDAL PITUITARY RESECTION  03/14/2004   infarcted pituitary tumor with features most suggestive of pituitary adenoma    Prior to Admission  medications   Medication Sig Start Date End Date Taking? Authorizing Provider  ondansetron (ZOFRAN ODT) 4 MG disintegrating tablet Take 1 tablet (4 mg total) by mouth every 8 (eight) hours as needed for nausea or vomiting. 03/05/21  Yes Blake Divine, MD  calcium carbonate (TUMS - DOSED IN MG ELEMENTAL CALCIUM) 500 MG chewable tablet Chew 1 tablet by mouth daily as needed for indigestion or heartburn.    [provider]  Calcium Citrate-Vitamin D (CALCIUM CITRATE + D3 PO) Take 1 tablet by mouth daily.    [provider]  hydrocortisone (CORTEF) 5 MG tablet Take 5 mg by mouth daily.    [provider]  levothyroxine (SYNTHROID, LEVOTHROID) 50 MCG tablet Take 50 mcg by mouth daily.  09/18/14   [provider]  potassium citrate (UROCIT-K) 10 MEQ (1080 MG) SR tablet Take 10 mEq by mouth daily.  11/15/12   [provider]  pravastatin (PRAVACHOL) 20 MG tablet Take 20 mg by mouth daily.    [provider]  sulfamethoxazole-trimethoprim (BACTRIM DS) 800-160 MG tablet Take 1 tablet by mouth every 12 (twelve) hours. 11/01/20   Zara Council A, PA-C  vitamin B-12 (CYANOCOBALAMIN) 1000 MCG tablet Take 1,000 mcg by mouth daily.    [provider]    Allergies Dilaudid [hydromorphone hcl], Dexamethasone sodium phosphate, Ezetimibe, Decadron [dexamethasone], and Latex  Family History  Problem Relation Age of Onset  . Cancer Mother        liver  . Lymphoma Mother   . Breast cancer Neg Hx     Social History Social History   Tobacco Use  . Smoking status: Never Smoker  . Smokeless tobacco: Never Used  Vaping Use  . Vaping Use: Never used  Substance Use Topics  . Alcohol use: Yes    Comment: socially, rarely  . Drug use: No    Review of Systems  Constitutional: No fever/chills Eyes: No visual changes. ENT: No sore throat. Cardiovascular: Denies chest pain. Respiratory: Denies shortness of breath.  Positive for  cough. Gastrointestinal: No abdominal pain.  Positive for nausea, vomiting, and diarrhea.  No constipation. Genitourinary: Negative for dysuria. Musculoskeletal: Negative for back pain. Skin: Negative for rash. Neurological: Negative for headaches, focal weakness or numbness.  ____________________________________________   PHYSICAL EXAM:  VITAL SIGNS: ED Triage Vitals  Enc Vitals Group     BP 03/05/21 1140 106/70     Pulse Rate 03/05/21 1140 63     Resp 03/05/21 1140 16     Temp 03/05/21 1140 98.2 F (36.8 C)     Temp Source 03/05/21 1140 Oral     SpO2 03/05/21 1140 97 %     Weight --      Height --      Head Circumference --      Peak Flow --      Pain Score 03/05/21 1137 6     Pain Loc --      Pain Edu? --      Excl. in [place]? --     Constitutional: Alert and  oriented. Eyes: Conjunctivae are normal. Head: Atraumatic. Nose: No congestion/rhinnorhea. Mouth/Throat: Mucous membranes are dry. Neck: Normal ROM Cardiovascular: Normal rate, regular rhythm. Grossly normal heart sounds. Respiratory: Normal respiratory effort.  No retractions. Lungs CTAB. Gastrointestinal: Soft and nontender. No distention. Genitourinary: deferred Musculoskeletal: No lower extremity tenderness nor edema. Neurologic:  Normal speech and language. No gross focal neurologic deficits are appreciated. Skin:  Skin is warm, dry and intact. No rash noted. Psychiatric: Mood and affect are normal. Speech and behavior are normal.  ____________________________________________   LABS (all labs ordered are listed, but only abnormal results are displayed)  Labs Reviewed  RESP PANEL BY RT-PCR (FLU A&B, COVID) ARPGX2 - Abnormal; Notable for the following components:      Result Value   SARS Coronavirus 2 by RT PCR POSITIVE (*)    All other components within normal limits  COMPREHENSIVE METABOLIC PANEL - Abnormal; Notable for the following components:   Sodium 130 (*)    Potassium 5.2 (*)    Chloride 97  (*)    CO2 20 (*)    BUN 39 (*)    Creatinine, Ser 1.59 (*)    Calcium 8.6 (*)    GFR, Estimated 34 (*)    All other components within normal limits  URINALYSIS, COMPLETE (UACMP) WITH MICROSCOPIC - Abnormal; Notable for the following components:   Color, Urine YELLOW (*)    APPearance HAZY (*)    Hgb urine dipstick MODERATE (*)    Ketones, ur 5 (*)    Leukocytes,Ua SMALL (*)    Bacteria, UA RARE (*)    All other components within normal limits  LIPASE, BLOOD  CBC    PROCEDURES  Procedure(s) performed (including Critical Care):  Procedures   ____________________________________________   INITIAL IMPRESSION / ASSESSMENT AND PLAN / ED COURSE       75 year old female with past medical history of hyperlipidemia, CKD, and hypothyroidism who presents to the ED with nausea, vomiting, diarrhea, and cough for the past 4 days.  Patient is afebrile and vital signs are reassuring.  She has no abdominal tenderness on exam although she does appear dehydrated.  We will hydrate with IV fluids and treat with Zofran.  Labs are remarkable for mild AKI, likely due to dehydration.  She does endorse a cough and we will test for COVID-19, check chest x-ray.  Chest x-ray reviewed by me and shows no infiltrate, edema, or effusion.  Testing for COVID-19 is positive although patient continues to deny any difficulty breathing and is maintaining O2 sats on room air.  She is feeling better following dose of Zofran and IV fluid bolus, has now been able to keep down water and crackers.  She is appropriate for discharge home with close PCP follow-up for recheck of labs within 1 week.  She will be prescribed Zofran and was counseled to return to the ED if she is unable to keep down fluids or develops any other worsening symptoms.  Patient agrees with plan.      ____________________________________________   FINAL CLINICAL IMPRESSION(S) / ED DIAGNOSES  Final diagnoses:  Nausea vomiting and diarrhea   Dehydration  COVID-19     ED Discharge Orders         Ordered    ondansetron (ZOFRAN ODT) 4 MG disintegrating tablet  Every 8 hours PRN        03/05/21 1559           Note:  This document was prepared using Dragon voice recognition software and  may include unintentional dictation errors.   Blake Divine, MD 03/05/21 [phone]

## 2021-03-05 NOTE — ED Triage Notes (Signed)
Pt comes with c/o diarrhea, vomiting and cough. Pt states possible covid. Pt states unable to hold anything down. Pt states aches all over. Pt states possibly dehydrated.

## 2021-03-05 NOTE — ED Notes (Signed)
Pt provided with saltine crackers and ice chips for PO challenge.

## 2021-03-06 ENCOUNTER — Telehealth: Payer: Self-pay

## 2021-03-06 NOTE — Telephone Encounter (Signed)
Called to discuss with patient about COVID-19 symptoms and the use of one of the available treatments for those with mild to moderate Covid symptoms and at a high risk of hospitalization.  Pt appears to qualify for outpatient treatment due to co-morbid conditions and/or a member of an at-risk group in accordance with the FDA Emergency Use Authorization.    Symptom onset: 03/01/21 Cough,nausea,vomiting,diarrhea Vaccinated: Yes Booster? Unknown Immunocompromised? No Qualifiers: CKD NIH Criteria: Tier 1  Unable to reach pt - Left message and call back number [phone].   Brianna Hammond

## 2021-03-08 ENCOUNTER — Inpatient Hospital Stay
Admission: EM | Admit: 2021-03-08 | Discharge: 2021-03-10 | DRG: 947 | Disposition: A | Discharge status: Home / Self Care | Payer: PPO | Attending: Internal Medicine | Admitting: Internal Medicine

## 2021-03-08 ENCOUNTER — Emergency Department: Payer: PPO

## 2021-03-08 ENCOUNTER — Other Ambulatory Visit: Payer: Self-pay

## 2021-03-08 DIAGNOSIS — T68XXXA Hypothermia, initial encounter: Secondary | ICD-10-CM | POA: Diagnosis present

## 2021-03-08 DIAGNOSIS — R059 Cough, unspecified: Secondary | ICD-10-CM | POA: Diagnosis present

## 2021-03-08 DIAGNOSIS — Z888 Allergy status to other drugs, medicaments and biological substances status: Secondary | ICD-10-CM

## 2021-03-08 DIAGNOSIS — Z905 Acquired absence of kidney: Secondary | ICD-10-CM | POA: Diagnosis not present

## 2021-03-08 DIAGNOSIS — E785 Hyperlipidemia, unspecified: Secondary | ICD-10-CM | POA: Diagnosis present

## 2021-03-08 DIAGNOSIS — N183 Chronic kidney disease, stage 3 unspecified: Secondary | ICD-10-CM | POA: Diagnosis present

## 2021-03-08 DIAGNOSIS — E039 Hypothyroidism, unspecified: Secondary | ICD-10-CM | POA: Diagnosis present

## 2021-03-08 DIAGNOSIS — E7800 Pure hypercholesterolemia, unspecified: Secondary | ICD-10-CM | POA: Diagnosis present

## 2021-03-08 DIAGNOSIS — D352 Benign neoplasm of pituitary gland: Secondary | ICD-10-CM | POA: Diagnosis present

## 2021-03-08 DIAGNOSIS — R680 Hypothermia, not associated with low environmental temperature: Secondary | ICD-10-CM | POA: Diagnosis present

## 2021-03-08 DIAGNOSIS — U071 COVID-19: Secondary | ICD-10-CM | POA: Diagnosis present

## 2021-03-08 DIAGNOSIS — Z79899 Other long term (current) drug therapy: Secondary | ICD-10-CM | POA: Diagnosis not present

## 2021-03-08 DIAGNOSIS — E2740 Unspecified adrenocortical insufficiency: Secondary | ICD-10-CM | POA: Diagnosis not present

## 2021-03-08 DIAGNOSIS — Z91040 Latex allergy status: Secondary | ICD-10-CM

## 2021-03-08 DIAGNOSIS — T68XXXD Hypothermia, subsequent encounter: Secondary | ICD-10-CM | POA: Diagnosis not present

## 2021-03-08 DIAGNOSIS — Z885 Allergy status to narcotic agent status: Secondary | ICD-10-CM

## 2021-03-08 DIAGNOSIS — Z79890 Hormone replacement therapy (postmenopausal): Secondary | ICD-10-CM | POA: Diagnosis not present

## 2021-03-08 DIAGNOSIS — E860 Dehydration: Secondary | ICD-10-CM | POA: Diagnosis present

## 2021-03-08 DIAGNOSIS — N1831 Chronic kidney disease, stage 3a: Secondary | ICD-10-CM | POA: Diagnosis not present

## 2021-03-08 DIAGNOSIS — R7989 Other specified abnormal findings of blood chemistry: Secondary | ICD-10-CM | POA: Diagnosis present

## 2021-03-08 LAB — COMPREHENSIVE METABOLIC PANEL
ALT: 12 U/L (ref 0–44)
AST: 32 U/L (ref 15–41)
Albumin: 3.9 g/dL (ref 3.5–5.0)
Alkaline Phosphatase: 54 U/L (ref 38–126)
Anion gap: 18 — ABNORMAL HIGH (ref 5–15)
BUN: 21 mg/dL (ref 8–23)
CO2: 18 mmol/L — ABNORMAL LOW (ref 22–32)
Calcium: 8.9 mg/dL (ref 8.9–10.3)
Chloride: 95 mmol/L — ABNORMAL LOW (ref 98–111)
Creatinine, Ser: 1.4 mg/dL — ABNORMAL HIGH (ref 0.44–1.00)
GFR, Estimated: 39 mL/min — ABNORMAL LOW (ref >60)
Glucose, Bld: 90 mg/dL (ref 70–99)
Potassium: 4 mmol/L (ref 3.5–5.1)
Sodium: 131 mmol/L — ABNORMAL LOW (ref 135–145)
Total Bilirubin: 2 mg/dL — ABNORMAL HIGH (ref 0.3–1.2)
Total Protein: 7.7 g/dL (ref 6.5–8.1)

## 2021-03-08 LAB — CBC WITH DIFFERENTIAL/PLATELET
Abs Immature Granulocytes: 0.04 10*3/uL (ref 0.00–0.07)
Basophils Absolute: 0 10*3/uL (ref 0.0–0.1)
Basophils Relative: 1 %
Eosinophils Absolute: 0.1 10*3/uL (ref 0.0–0.5)
Eosinophils Relative: 2 %
HCT: 39 % (ref 36.0–46.0)
Hemoglobin: 13 g/dL (ref 12.0–15.0)
Immature Granulocytes: 1 %
Lymphocytes Relative: 13 %
Lymphs Abs: 0.8 10*3/uL (ref 0.7–4.0)
MCH: 27.2 pg (ref 26.0–34.0)
MCHC: 33.3 g/dL (ref 30.0–36.0)
MCV: 81.6 fL (ref 80.0–100.0)
Monocytes Absolute: 0.4 10*3/uL (ref 0.1–1.0)
Monocytes Relative: 7 %
Neutro Abs: 4.7 10*3/uL (ref 1.7–7.7)
Neutrophils Relative %: 76 %
Platelets: 231 10*3/uL (ref 150–400)
RBC: 4.78 MIL/uL (ref 3.87–5.11)
RDW: 12.8 % (ref 11.5–15.5)
WBC: 6.1 10*3/uL (ref 4.0–10.5)
nRBC: 0 % (ref 0.0–0.2)

## 2021-03-08 LAB — PROTIME-INR
INR: 1.1 (ref 0.8–1.2)
Prothrombin Time: 14.2 seconds (ref 11.4–15.2)

## 2021-03-08 LAB — URINALYSIS, COMPLETE (UACMP) WITH MICROSCOPIC
Bilirubin Urine: NEGATIVE
Glucose, UA: NEGATIVE mg/dL
Ketones, ur: 20 mg/dL — AB
Leukocytes,Ua: NEGATIVE
Nitrite: NEGATIVE
Protein, ur: NEGATIVE mg/dL
Specific Gravity, Urine: 1.015 (ref 1.005–1.030)
pH: 5 (ref 5.0–8.0)

## 2021-03-08 LAB — CORTISOL: Cortisol, Plasma: 4.5 ug/dL

## 2021-03-08 LAB — CORTISOL-AM, BLOOD: Cortisol - AM: 4.5 ug/dL — ABNORMAL LOW (ref 6.7–22.6)

## 2021-03-08 LAB — APTT: aPTT: 46 seconds — ABNORMAL HIGH (ref 24–36)

## 2021-03-08 LAB — LACTIC ACID, PLASMA
Lactic Acid, Venous: 3.3 mmol/L (ref 0.5–1.9)
Lactic Acid, Venous: 1.3 mmol/L (ref 0.5–1.9)

## 2021-03-08 LAB — T4, FREE: Free T4: 0.75 ng/dL (ref 0.61–1.12)

## 2021-03-08 LAB — TSH: TSH: 0.107 u[IU]/mL — ABNORMAL LOW (ref 0.350–4.500)

## 2021-03-08 MED ORDER — LACTATED RINGERS IV SOLN: INTRAVENOUS | Status: AC

## 2021-03-08 MED ORDER — IBUPROFEN 400 MG PO TABS: 400.0000 mg | ORAL_TABLET | Freq: Four times a day (QID) | ORAL | Status: DC | PRN

## 2021-03-08 MED ORDER — HYDROCORTISONE NA SUCCINATE PF 100 MG IJ SOLR
100.0000 mg | Freq: Three times a day (TID) | INTRAMUSCULAR | Status: DC
  Administered 2021-03-08 – 2021-03-10 (×7): 100 mg via INTRAVENOUS
  Filled 2021-03-08 (×7): qty 2

## 2021-03-08 MED ORDER — SODIUM CHLORIDE 0.9 % IV BOLUS
1000.0000 mL | Freq: Once | INTRAVENOUS | Status: AC
  Administered 2021-03-08: 1000 mL via INTRAVENOUS

## 2021-03-08 MED ORDER — ONDANSETRON HCL 4 MG PO TABS: 4.0000 mg | ORAL_TABLET | Freq: Four times a day (QID) | ORAL | Status: DC | PRN

## 2021-03-08 MED ORDER — ENOXAPARIN SODIUM 30 MG/0.3ML IJ SOSY
30.0000 mg | PREFILLED_SYRINGE | INTRAMUSCULAR | Status: DC
  Administered 2021-03-08 – 2021-03-09 (×2): 30 mg via SUBCUTANEOUS
  Filled 2021-03-08 (×2): qty 0.3

## 2021-03-08 MED ORDER — LEVOTHYROXINE SODIUM 50 MCG PO TABS
50.0000 ug | ORAL_TABLET | Freq: Every day | ORAL | Status: DC
  Administered 2021-03-09 – 2021-03-10 (×2): 50 ug via ORAL
  Filled 2021-03-08 (×2): qty 1

## 2021-03-08 MED ORDER — LACTATED RINGERS IV SOLN: INTRAVENOUS | Status: DC

## 2021-03-08 MED ORDER — TRAZODONE HCL 50 MG PO TABS
25.0000 mg | ORAL_TABLET | Freq: Every evening | ORAL | Status: DC | PRN
  Filled 2021-03-08: qty 1

## 2021-03-08 MED ORDER — SODIUM CHLORIDE 0.9 % IV SOLN
100.0000 mg | Freq: Every day | INTRAVENOUS | Status: DC
  Administered 2021-03-09 – 2021-03-10 (×2): 100 mg via INTRAVENOUS
  Filled 2021-03-08: qty 100
  Filled 2021-03-08: qty 20

## 2021-03-08 MED ORDER — ONDANSETRON HCL 4 MG/2ML IJ SOLN: 4.0000 mg | Freq: Four times a day (QID) | INTRAMUSCULAR | Status: DC | PRN

## 2021-03-08 MED ORDER — POLYETHYLENE GLYCOL 3350 17 G PO PACK: 17.0000 g | PACK | Freq: Every day | ORAL | Status: DC | PRN

## 2021-03-08 MED ORDER — SODIUM CHLORIDE 0.9 % IV SOLN
200.0000 mg | Freq: Once | INTRAVENOUS | Status: AC
  Administered 2021-03-08: 200 mg via INTRAVENOUS
  Filled 2021-03-08: qty 40

## 2021-03-08 NOTE — Sepsis Progress Note (Signed)
Code sepsis protocol being monitored by eLink. 

## 2021-03-08 NOTE — Progress Notes (Signed)
Remdesivir - Pharmacy Brief Note   O:  ALT: 12 CXR: neg SpO2: 100% on RA   A/P:  Remdesivir 200 mg IVPB once followed by 100 mg IVPB daily x 4 days.   Chinita Greenland PharmD Clinical Pharmacist 03/08/2021

## 2021-03-08 NOTE — Sepsis Progress Note (Signed)
Notified provider of need to order antibiotics. Pt is Covid -19 positive

## 2021-03-08 NOTE — Plan of Care (Signed)

## 2021-03-08 NOTE — Sepsis Progress Note (Signed)
Lactic acid 3.3  Has trended down to 1.3. No antibiotics ordered

## 2021-03-08 NOTE — ED Notes (Signed)
bair hugger removed 

## 2021-03-08 NOTE — Progress Notes (Signed)
PHARMACIST - PHYSICIAN COMMUNICATION  CONCERNING:  Enoxaparin (Lovenox) for DVT Prophylaxis    RECOMMENDATION: Patient was prescribed enoxaprin 40mg  q24 hours for VTE prophylaxis.   Filed Weights   03/08/21 1100  Weight: 52.6 kg (116 lb)    Body mass index is 22.65 kg/m.  Estimated Creatinine Clearance: 25.3 mL/min (A) (by C-G formula based on SCr of 1.4 mg/dL (H)).  Patient is candidate for enoxaparin 30mg  every 24 hours based on CrCl <58ml/min or Weight <45kg  DESCRIPTION: Pharmacy has adjusted enoxaparin dose per [facility] policy.  Patient is now receiving enoxaparin 30 mg every 24 hours    Dorothe Pea, PharmD, BCPS Clinical Pharmacist  03/08/2021 8:35 PM

## 2021-03-08 NOTE — Progress Notes (Signed)
CODE SEPSIS - PHARMACY COMMUNICATION  **Broad Spectrum Antibiotics should be administered within 1 hour of Sepsis diagnosis**  Time Code Sepsis Called/Page Received: 1313  Antibiotics Ordered: none, antiviral- remdesivir  Time of 1st antibiotic administration:    Additional action taken by pharmacy: messaged ED MD and attending MD to clarify if needed antibiotics ordered?   MD replied that seems all from covid  If necessary, Name of Provider/Nurse Contacted: Z.Smith, M.Eckstat    Noralee Space ,PharmD Clinical Pharmacist  03/08/2021  2:01 PM

## 2021-03-08 NOTE — Progress Notes (Signed)
Atempted to reach by phone no answer

## 2021-03-08 NOTE — ED Notes (Signed)
Bill RN aware of assigned bed 

## 2021-03-08 NOTE — ED Notes (Signed)
Pt lactic is 3.3, PA notified

## 2021-03-08 NOTE — ED Notes (Signed)
Retail banker applied at [employer]

## 2021-03-08 NOTE — ED Provider Notes (Signed)
[facility] Emergency Department Provider Note   ____________________________________________   Event Date/Time   First MD Initiated Contact with Patient 03/08/21 1131     (approximate)  I have reviewed the triage vital signs and the nursing notes.   HISTORY  Chief Complaint Covid Positive  HPI Brianna Hammond is a 75 y.o. female with a history of GERD, CKD, and hypothyroidism, returns to the ED for ongoing symptoms related to recent COVID diagnosis.  Patient was evaluated in the ED 3 days prior, with complaint at that time of nausea and vomiting.  She was stable with a negative chest x-ray, and discharged after fluid bolus and antiemetics.  She presents today stating she has had ongoing fatigue with nausea without vomiting, and diarrhea.  She denies any frank fevers, chest pain, or shortness of breath.  She has tolerated p.o. intake including melons and water.     Past Medical History:  Diagnosis Date  . Acquired hypothyroidism 04/04/2016  . Age-related osteoporosis without current pathological fracture 09/29/2016  . Arthritis   . Benign neoplasm of pituitary gland and craniopharyngeal duct ([place]) 11/15/2012  . Calculus of kidney 11/15/2012  . Chronic renal failure   . Fibrocystic breast disease   . GERD (gastroesophageal reflux disease)   . Hepatitis A    age 66  . History of kidney stones   . History of pituitary tumor 08/14/2017   Overview:  s/p removal  . History of radiation therapy 1/25, 1/27, 1/29, 2/2, 11/15/14   SRS pituitary adenoma 5 Gy in 5 fx, total 25 Gy  . Hypothyroid   . Infection of urinary tract 11/15/2012  . Kidney calculus   . Low serum cortisol level ([place]) 09/10/2016  . Microscopic hematuria 11/15/2012  . Morgagni hernia   . 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 Osteoporosis   . Pituitary adenoma ([place]) 2005  . Pituitary adenoma ([place]) 10/30/2014  . Pure hypercholesterolemia 04/04/2016  . Small kidney, unilateral  12/20/2014  . TMJ syndrome   . Trigeminal herpes zoster   . Vaccine counseling 09/10/2016    Patient Active Problem List   Diagnosis Date Noted  . Hypothermia 03/08/2021  . Non-recurrent inguinal hernia without obstruction or gangrene   . CKD (chronic kidney disease) stage 3, GFR 30-59 ml/min (HCC) 10/26/2017  . History of pituitary tumor 08/14/2017  . Age-related osteoporosis without current pathological fracture 09/29/2016  . Low serum cortisol level ([place]) 09/10/2016  . Encounter for general adult medical examination without abnormal findings 09/10/2016  . Morgagni hernia   . Pure hypercholesterolemia 04/04/2016  . Acquired hypothyroidism 04/04/2016  . Small kidney, unilateral 12/20/2014  . Pituitary adenoma ([place]) 10/30/2014  . Infection of urinary tract 11/15/2012  . 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 Microscopic hematuria 11/15/2012  . Calculus of kidney 11/15/2012  . Benign neoplasm of pituitary gland and craniopharyngeal duct ([place]) 11/15/2012    Past Surgical History:  Procedure Laterality Date  . BRAIN SURGERY    . BREAST BIOPSY Left 1986   neg  . COLONOSCOPY    . HERNIA REPAIR     morgagnia  . HIATAL HERNIA REPAIR N/A 05/01/2016   Procedure: LAPAROSCOPIC REPAIR OF MORGAGNI HERNIA;  Surgeon: Jules Husbands, MD;  Location: [facility] ORS;  Service: General;  Laterality: N/A;  . INGUINAL HERNIA REPAIR Bilateral 10/28/2017   Procedure: LAPAROSCOPIC BILATERAL INGUINAL HERNIA REPAIR;  Surgeon: Jules Husbands, MD;  Location: [facility] ORS;  Service: General;  Laterality: Bilateral;  . NEPHRECTOMY RADICAL Left 08/2003   for metanephric adenoma  . TONSILLECTOMY     age 7  . TRANSPHENOIDAL PITUITARY RESECTION  03/14/2004   infarcted pituitary tumor with features most suggestive of pituitary adenoma    Prior to Admission medications   Medication Sig Start Date End Date Taking? Authorizing Provider  calcium carbonate (TUMS - DOSED IN MG ELEMENTAL CALCIUM) 500 MG  chewable tablet Chew 1 tablet by mouth daily as needed for indigestion or heartburn.    [provider]  Calcium Citrate-Vitamin D (CALCIUM CITRATE + D3 PO) Take 1 tablet by mouth daily.    [provider]  hydrocortisone (CORTEF) 5 MG tablet Take 5 mg by mouth daily.    [provider]  levothyroxine (SYNTHROID, LEVOTHROID) 50 MCG tablet Take 50 mcg by mouth daily.  09/18/14   [provider]  ondansetron (ZOFRAN ODT) 4 MG disintegrating tablet Take 1 tablet (4 mg total) by mouth every 8 (eight) hours as needed for nausea or vomiting. 03/05/21   Blake Divine, MD  potassium citrate (UROCIT-K) 10 MEQ (1080 MG) SR tablet Take 10 mEq by mouth daily.  11/15/12   [provider]  pravastatin (PRAVACHOL) 20 MG tablet Take 20 mg by mouth daily.    [provider]  sulfamethoxazole-trimethoprim (BACTRIM DS) 800-160 MG tablet Take 1 tablet by mouth every 12 (twelve) hours. 11/01/20   Zara Council A, PA-C  vitamin B-12 (CYANOCOBALAMIN) 1000 MCG tablet Take 1,000 mcg by mouth daily.    [provider]    Allergies Dilaudid [hydromorphone hcl], Dexamethasone sodium phosphate, Ezetimibe, Decadron [dexamethasone], and Latex  Family History  Problem Relation Age of Onset  . Cancer Mother        liver  . Lymphoma Mother   . Breast cancer Neg Hx     Social History Social History   Tobacco Use  . Smoking status: Never Smoker  . Smokeless tobacco: Never Used  Vaping Use  . Vaping Use: Never used  Substance Use Topics  . Alcohol use: Yes    Comment: socially, rarely  . Drug use: No    Review of Systems  Constitutional: No fever. Reports chills and cold temp Eyes: No visual changes. ENT: No sore throat. Cardiovascular: Denies chest pain. Respiratory: Denies shortness of breath. Gastrointestinal: No abdominal pain.  Reports nausea and diarrhea. Denies vomiting. No constipation. Genitourinary: Negative for  dysuria. Musculoskeletal: Negative for back pain. Skin: Negative for rash. Neurological: Negative for headaches, focal weakness or numbness. ____________________________________________   PHYSICAL EXAM:  VITAL SIGNS: ED Triage Vitals  Enc Vitals Group     BP 03/08/21 1059 100/78     Pulse Rate 03/08/21 1059 72     Resp 03/08/21 1059 16     Temp 03/08/21 1104 (!) 92.6 F (33.7 C)     Temp Source 03/08/21 1059 Oral     SpO2 03/08/21 1059 100 %     Weight 03/08/21 1100 116 lb (52.6 kg)     Height 03/08/21 1100 5' (1.524 m)     Head Circumference --      Peak Flow --      Pain Score 03/08/21 1100 0     Pain Loc --      Pain Edu? --      Excl. in [place]? --     Constitutional: Alert and oriented. Well appearing and in no acute distress. Eyes: Conjunctivae are normal. PERRL. EOMI. Head: Atraumatic. Nose: No congestion/rhinnorhea. Mouth/Throat:  Mucous membranes are moist.  Oropharynx non-erythematous. Neck: No stridor.   Cardiovascular: Normal rate, regular rhythm. Grossly normal heart sounds.  Good peripheral circulation. Respiratory: Normal respiratory effort.  No retractions. Lungs CTAB. Gastrointestinal: Soft and nontender. No distention. No abdominal bruits. No CVA tenderness. Musculoskeletal: No lower extremity tenderness nor edema.  No joint effusions. Neurologic:  Normal speech and language. No gross focal neurologic deficits are appreciated. No gait instability. Skin:  Skin is warm, dry and intact. No rash noted. Psychiatric: Mood and affect are normal. Speech and behavior are normal. ____________________________________________   LABS (all labs ordered are listed, but only abnormal results are displayed)  Labs Reviewed  COMPREHENSIVE METABOLIC PANEL - Abnormal; Notable for the following components:      Result Value   Sodium 131 (*)    Chloride 95 (*)    CO2 18 (*)    Creatinine, Ser 1.40 (*)    Total Bilirubin 2.0 (*)    GFR, Estimated 39 (*)    Anion gap 18  (*)    All other components within normal limits  LACTIC ACID, PLASMA - Abnormal; Notable for the following components:   Lactic Acid, Venous 3.3 (*)    All other components within normal limits  CULTURE, BLOOD (ROUTINE X 2)  CULTURE, BLOOD (ROUTINE X 2)  CBC WITH DIFFERENTIAL/PLATELET  PROTIME-INR  LACTIC ACID, PLASMA  URINALYSIS, COMPLETE (UACMP) WITH MICROSCOPIC  APTT   ____________________________________________  EKG  A fib Vent rate 93 bpm QRS duration 154 ms LBBB No STEMI ____________________________________________  RADIOLOGY I, Melvenia Needles, personally viewed and evaluated these images (plain radiographs) as part of my medical decision making, as well as reviewing the written report by the radiologist.  ED MD interpretation:  agree with report  Official radiology report(s): DG Chest 2 View  Result Date: 03/08/2021 CLINICAL DATA:  Sepsis. EXAM: CHEST - 2 VIEW COMPARISON:  Mar 05, 2021. FINDINGS: The heart size and mediastinal contours are within normal limits. Both lungs are clear. The visualized skeletal structures are unremarkable. IMPRESSION: No active cardiopulmonary disease. Electronically Signed   By: Marijo Conception M.D.   On: 03/08/2021 12:22   ____________________________________________   PROCEDURES  Procedure(s) performed (including Critical Care):  Procedures  Sepsis - undifferentiated IVFs per sepsis protocal Remdesivir per pharmacy consult ____________________________________________   INITIAL IMPRESSION / ASSESSMENT AND PLAN / ED COURSE  As part of my medical decision making, I reviewed the following data within the [place] reviewed critical lactic, EKG interpreted LBBB and no ST segment changes, Radiograph reviewed WNL, Discussed with admitting physician Melven Sartorius and Notes from prior ED visits   Patient with above medical history, presents to the ED after recent COVID diagnosis.  She presents with  complaints of ongoing chills, low body temp, and diarrhea.  She was evaluated for her complaints and found to be hypothermic at 93.5 F.  And noted to have a critical lactic acid of 3.3.  Case was discussed with hospitalist, and she was admitted to the service for management of her hypothermia and sepsis related to recent COVID diagnosis.  Clinical Course as of 03/08/21 1338  Fri Mar 08, 2021  1203 Temp(!): 93.5 F (34.2 C) Hypothermia (93.5 F per rectum) Bear-hugger and warmed IV fluids [JM]  1205 Lactic Acid, Venous(!!): 3.3 Critical Lactic noted [JM]    Clinical Course User Index [JM] Hovanes Hymas, Dannielle Karvonen, PA-C     ____________________________________________   FINAL CLINICAL IMPRESSION(S) / ED DIAGNOSES  Final diagnoses:  COVID-19  Hypothermia, initial encounter  Sepsis due to COVID-19 [facility])     ED Discharge Orders    None      *Please note:  Brianna Hammond was evaluated in Emergency Department on 03/08/2021 for the symptoms described in the history of present illness. She was evaluated in the context of the global COVID-19 pandemic, which necessitated consideration that the patient might be at risk for infection with the SARS-CoV-2 virus that causes COVID-19. Institutional protocols and algorithms that pertain to the evaluation of patients at risk for COVID-19 are in a state of rapid change based on information released by regulatory bodies including the CDC and federal and state organizations. These policies and algorithms were followed during the patient's care in the ED.  Some ED evaluations and interventions may be delayed as a result of limited staffing during and the pandemic.*   Note:  This document was prepared using Dragon voice recognition software and may include unintentional dictation errors.    Melvenia Needles, PA-C 03/08/21 1338    Lucrezia Starch, MD 03/08/21 1440

## 2021-03-08 NOTE — ED Triage Notes (Signed)
Pt states she was dx with covid on Tuesday here , states she has had fatigue with a nausea and diarrhea, states she has not had anymore vomiting since seen here on Tuesday. States she has had night sweats and chills. Pt is alert in NAD at present.

## 2021-03-08 NOTE — H&P (Signed)
[facility] History and Physical  Brianna Hammond WGN:[id number] DOB: 1946/05/04 DOA: 03/08/2021  Referring physician: Dr. Tamala Julian PCP: Dion Body, MD   Chief Complaint: fatigue  HPI: Brianna Hammond is Brianna Hammond 75 y.o. female with history of pituitary adenoma on chronic cortisol replacement, hypothyroidism, CKD stage III, who presents with fatigue and weakness.  Patient was seen in the [facility] ED 2 days prior and diagnosed with COVID.  At that time she was weak and nauseous, she was given fluids and medications, she felt better and was sent home.  Today she reports that she has had ongoing fatigue, nausea, and inability to take p.o., which prompted her to return to the ED.  She reports she has been unable to take several of her medications for almost Brianna Hammond week, including her cortisol and Synthroid.  In addition she has not been able to keep any food or liquid down since she was last in the ED.  She denies any chest pain or shortness of breath.  She denies abdominal pain, burning or pain with urination, or skin rash.  In the ED initial vital signs notable for temperature of 92.6 Fahrenheit confirmed rectally, otherwise normal vital signs.  She was immediately placed under Brianna Hammond Retail [employer].  Lab work-up notable for lactic acid of 3.3 that down trended to 1.3, CMP with mild hyponatremia 131, creatinine at baseline of 1.4, and mild T bili elevation at 2.0, up from 1 2 days prior.  CBC was unremarkable.  Chest x-ray was unremarkable.  She was given Brianna Hammond 1 L NS bolus, started on an LR GTT, and given Brianna Hammond first dose of remdesivir.  She was admitted for further management.  Review of Systems:  Pertinent positives and negative per HPI, all others reviewed and negative  Past Medical History:  Diagnosis Date  . Acquired hypothyroidism 04/04/2016  . Age-related osteoporosis without current pathological fracture 09/29/2016  . Arthritis   . Benign neoplasm of pituitary gland and craniopharyngeal duct ([place])  11/15/2012  . Calculus of kidney 11/15/2012  . Chronic renal failure   . Fibrocystic breast disease   . GERD (gastroesophageal reflux disease)   . Hepatitis Brianna Hammond    age 50  . History of kidney stones   . History of pituitary tumor 08/14/2017   Overview:  s/p removal  . History of radiation therapy 1/25, 1/27, 1/29, 2/2, 11/15/14   SRS pituitary adenoma 5 Gy in 5 fx, total 25 Gy  . Hypothyroid   . Infection of urinary tract 11/15/2012  . Kidney calculus   . Low serum cortisol level ([place]) 09/10/2016  . Microscopic hematuria 11/15/2012  . Morgagni hernia   . 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 Osteoporosis   . Pituitary adenoma ([place]) 2005  . Pituitary adenoma ([place]) 10/30/2014  . Pure hypercholesterolemia 04/04/2016  . Small kidney, unilateral 12/20/2014  . TMJ syndrome   . Trigeminal herpes zoster   . Vaccine counseling 09/10/2016   Past Surgical History:  Procedure Laterality Date  . BRAIN SURGERY    . BREAST BIOPSY Left 1986   neg  . COLONOSCOPY    . HERNIA REPAIR     morgagnia  . HIATAL HERNIA REPAIR N/Brianna Hammond 05/01/2016   Procedure: LAPAROSCOPIC REPAIR OF MORGAGNI HERNIA;  Surgeon: Jules Husbands, MD;  Location: [facility] ORS;  Service: General;  Laterality: N/Brianna Hammond;  . INGUINAL HERNIA REPAIR Bilateral 10/28/2017   Procedure: LAPAROSCOPIC BILATERAL INGUINAL HERNIA REPAIR;  Surgeon: Jules Husbands, MD;  Location: [facility] ORS;  Service: General;  Laterality: Bilateral;  . NEPHRECTOMY RADICAL Left 08/2003   for metanephric adenoma  . TONSILLECTOMY     age 64  . TRANSPHENOIDAL PITUITARY RESECTION  03/14/2004   infarcted pituitary tumor with features most suggestive of pituitary adenoma   Social History:  reports that she has never smoked. She has never used smokeless tobacco. She reports current alcohol use. She reports that she does not use drugs.  Allergies  Allergen Reactions  . Dilaudid [Hydromorphone Hcl] Anaphylaxis    Apnea/ required narcan in pacu to reverse  . Dexamethasone  Sodium Phosphate Hives  . Ezetimibe Other (See Comments)    Muscle pain   . Decadron [Dexamethasone] Other (See Comments)    Flushing, hives  . Latex Rash    And blisters    Family History  Problem Relation Age of Onset  . Cancer Mother        liver  . Lymphoma Mother   . Breast cancer Neg Hx      Prior to Admission medications   Medication Sig Start Date End Date Taking? Authorizing Provider  calcium carbonate (TUMS - DOSED IN MG ELEMENTAL CALCIUM) 500 MG chewable tablet Chew 1 tablet by mouth daily as needed for indigestion or heartburn.    [provider]  Calcium Citrate-Vitamin D (CALCIUM CITRATE + D3 PO) Take 1 tablet by mouth daily.    [provider]  hydrocortisone (CORTEF) 5 MG tablet Take 5 mg by mouth daily.    [provider]  levothyroxine (SYNTHROID, LEVOTHROID) 50 MCG tablet Take 50 mcg by mouth daily.  09/18/14   [provider]  ondansetron (ZOFRAN ODT) 4 MG disintegrating tablet Take 1 tablet (4 mg total) by mouth every 8 (eight) hours as needed for nausea or vomiting. 03/05/21   Blake Divine, MD  potassium citrate (UROCIT-K) 10 MEQ (1080 MG) SR tablet Take 10 mEq by mouth daily.  11/15/12   [provider]  pravastatin (PRAVACHOL) 20 MG tablet Take 20 mg by mouth daily.    [provider]  sulfamethoxazole-trimethoprim (BACTRIM DS) 800-160 MG tablet Take 1 tablet by mouth every 12 (twelve) hours. 11/01/20   Zara Council A, PA-C  vitamin B-12 (CYANOCOBALAMIN) 1000 MCG tablet Take 1,000 mcg by mouth daily.    [provider]   Physical Exam: Vitals:   03/08/21 1230 03/08/21 1300 03/08/21 1321 03/08/21 1322  BP: 121/72 121/85    Pulse: 76 76    Resp: 12 14    Temp:   (!) 97.2 F (36.2 C) 98.4 F (36.9 C)  TempSrc:   Rectal Oral  SpO2: 100% 100%    Weight:      Height:        Wt Readings from Last 3 Encounters:  03/08/21 52.6 kg  11/01/20 54.9 kg  07/18/20 56.2 kg     . General:   Appears calm, mildly ill . Eyes: PERRL, normal lids, irises & conjunctiva . ENT: grossly normal hearing, lips & tongue . Neck: no masses . Cardiovascular: RRR, no m/r/g. No LE edema. Marland Kitchen Respiratory: CTA bilaterally, no w/r/r. Normal respiratory effort. . Abdomen: soft, ntnd . Skin: no rash or induration seen on limited exam . Musculoskeletal: grossly normal tone BUE/BLE . Psychiatric: grossly normal mood and affect, speech fluent and appropriate . Neurologic: grossly non-focal.          Labs on Admission:  Basic Metabolic Panel: Recent Labs  Lab 03/05/21 1139 03/08/21 1112  NA 130* 131*  K  5.2* 4.0  CL 97* 95*  CO2 20* 18*  GLUCOSE 71 90  BUN 39* 21  CREATININE 1.59* 1.40*  CALCIUM 8.6* 8.9   Liver Function Tests: Recent Labs  Lab 03/05/21 1139 03/08/21 1112  AST 25 32  ALT 10 12  ALKPHOS 45 54  BILITOT 1.1 2.0*  PROT 7.1 7.7  ALBUMIN 3.8 3.9   Recent Labs  Lab 03/05/21 1139  LIPASE 31   No results for input(s): AMMONIA in the last 168 hours. CBC: Recent Labs  Lab 03/05/21 1139 03/08/21 1112  WBC 5.7 6.1  NEUTROABS  --  4.7  HGB 12.5 13.0  HCT 36.5 39.0  MCV 80.4 81.6  PLT 202 231   Cardiac Enzymes: No results for input(s): CKTOTAL, CKMB, CKMBINDEX, TROPONINI in the last 168 hours.  BNP (last 3 results) No results for input(s): BNP in the last 8760 hours.  ProBNP (last 3 results) No results for input(s): PROBNP in the last 8760 hours.  CBG: No results for input(s): GLUCAP in the last 168 hours.  Radiological Exams on Admission: DG Chest 2 View  Result Date: 03/08/2021 CLINICAL DATA:  Sepsis. EXAM: CHEST - 2 VIEW COMPARISON:  Mar 05, 2021. FINDINGS: The heart size and mediastinal contours are within normal limits. Both lungs are clear. The visualized skeletal structures are unremarkable. IMPRESSION: No active cardiopulmonary disease. Electronically Signed   By: Marijo Conception M.D.   On: 03/08/2021 12:22    EKG: Not  obtained  Assessment/Plan Active Problems:   Pituitary adenoma (HCC)   Acquired hypothyroidism   Low serum cortisol level (HCC)   CKD (chronic kidney disease) stage 3, GFR 30-59 ml/min ([place])   Hypothermia   Brianna Hammond is Brianna Hammond 75 y.o. female with history of pituitary adenoma on chronic cortisol replacement, hypothyroidism, CKD stage III, who presents with fatigue and is found to have hypothermia in the setting of recent COVID diagnosis.  #COVID-19 Infection #Hypothermia #Adrenal Insufficiency #Hypothyroidism Patient presenting with true hypothermia confirmed on rectal temp down to 93 F.  Is almost certainly related to adrenal insufficiency, as patient reports that due to nausea she has been unable to take her cortisol for almost 1 week.  Has also been unable to take her Synthroid though suspect this is contributing to Brianna Hammond much lesser degree.  Although she has not hypoxic she is having Brianna Hammond significant illness in the setting of COVID, and therefore I will treat her with usual medications. - Hydrocortisone 100 mg IV every 8 hours.  She will likely need to taper. - Continue Synthroid p.o. - Remdesivir per pharmacy protocol, already receiving steroids - Maintain Bair hugger until able to regulate her temperature without assistance -Check TFTs, cortisol level - Zofran as needed for nausea - LR at 125 cc/h  #Known Medical Problems Hyperlipidemia- hold statin for now  Code Status: Full code, confirmed DVT Prophylaxis: Lovenox Family Communication: None Disposition Plan: Inpatient, MedSurg  Time spent: 50 min  Clarnce Flock MD/[facility]  Note:  This document was prepared using Systems analyst and may include unintentional dictation errors.

## 2021-03-09 DIAGNOSIS — N183 Chronic kidney disease, stage 3 unspecified: Secondary | ICD-10-CM

## 2021-03-09 DIAGNOSIS — E039 Hypothyroidism, unspecified: Secondary | ICD-10-CM

## 2021-03-09 DIAGNOSIS — E2740 Unspecified adrenocortical insufficiency: Secondary | ICD-10-CM

## 2021-03-09 DIAGNOSIS — T68XXXD Hypothermia, subsequent encounter: Secondary | ICD-10-CM

## 2021-03-09 DIAGNOSIS — D352 Benign neoplasm of pituitary gland: Secondary | ICD-10-CM

## 2021-03-09 LAB — COMPREHENSIVE METABOLIC PANEL
ALT: 10 U/L (ref 0–44)
AST: 18 U/L (ref 15–41)
Albumin: 3 g/dL — ABNORMAL LOW (ref 3.5–5.0)
Alkaline Phosphatase: 46 U/L (ref 38–126)
Anion gap: 9 (ref 5–15)
BUN: 20 mg/dL (ref 8–23)
CO2: 21 mmol/L — ABNORMAL LOW (ref 22–32)
Calcium: 8 mg/dL — ABNORMAL LOW (ref 8.9–10.3)
Chloride: 105 mmol/L (ref 98–111)
Creatinine, Ser: 1.17 mg/dL — ABNORMAL HIGH (ref 0.44–1.00)
GFR, Estimated: 49 mL/min — ABNORMAL LOW (ref >60)
Glucose, Bld: 126 mg/dL — ABNORMAL HIGH (ref 70–99)
Potassium: 5.1 mmol/L (ref 3.5–5.1)
Sodium: 135 mmol/L (ref 135–145)
Total Bilirubin: 1.4 mg/dL — ABNORMAL HIGH (ref 0.3–1.2)
Total Protein: 5.9 g/dL — ABNORMAL LOW (ref 6.5–8.1)

## 2021-03-09 LAB — CBC
HCT: 32.8 % — ABNORMAL LOW (ref 36.0–46.0)
Hemoglobin: 11.3 g/dL — ABNORMAL LOW (ref 12.0–15.0)
MCH: 27.9 pg (ref 26.0–34.0)
MCHC: 34.5 g/dL (ref 30.0–36.0)
MCV: 81 fL (ref 80.0–100.0)
Platelets: 200 10*3/uL (ref 150–400)
RBC: 4.05 MIL/uL (ref 3.87–5.11)
RDW: 12.9 % (ref 11.5–15.5)
WBC: 3.4 10*3/uL — ABNORMAL LOW (ref 4.0–10.5)
nRBC: 0 % (ref 0.0–0.2)

## 2021-03-09 LAB — T4: T4, Total: 6.1 ug/dL (ref 4.5–12.0)

## 2021-03-09 LAB — T3, FREE: T3, Free: 1.1 pg/mL — ABNORMAL LOW (ref 2.0–4.4)

## 2021-03-09 LAB — T3: T3, Total: 47 ng/dL — ABNORMAL LOW (ref 71–180)

## 2021-03-09 NOTE — Progress Notes (Signed)
Chaplain Maggie visited with patient on the telephone while "on call". Patient requested prayer of thanks for her improvement and for all the people in the hospital. Patient expressed gratitude for her physician and those caring for her. Chaplain suggested including prayer for concerns of her family. Patient and Chaplain joined in prayer together. Continued support available by phone as needed. Page on call Chaplain for support.

## 2021-03-09 NOTE — Progress Notes (Signed)
PROGRESS NOTE    AUDI CONOVER  FOY:[id number] DOB: September 07, 1946 DOA: 03/08/2021 PCP: Dion Body, MD    Brief Narrative:  QUANTISHA MARSICANO is a 75 y.o. female with history of pituitary adenoma on chronic cortisol replacement, hypothyroidism, CKD stage III, who presents with fatigue and weakness.  Patient was seen in the [facility] ED 2 days prior and diagnosed with COVID.  At that time she was weak and nauseous, she was given fluids and medications, she felt better and was sent home. She returned with ongoing fatigue, nausea, and inability to take p.o., which prompted her to return to the ED.  She reports she has been unable to take several of her medications for almost a week, including her cortisol and Synthroid.  In addition she has not been able to keep any food or liquid down . In Ed found with be hypothermic.    Consultants:     Procedures:   Antimicrobials:   Remdesivir    Subjective: Feeling littler better today compared to  yesterday. No vomiting, or nausea this am. No diarrhea or sob, or cp  Objective: Vitals:   03/08/21 1730 03/08/21 2009 03/08/21 2333 03/09/21 0332  BP: 129/86 129/73 126/86 128/81  Pulse: 75 72 81 73  Resp: 20 14 16 14   Temp:  98.5 F (36.9 C) 97.9 F (36.6 C) 98.2 F (36.8 C)  TempSrc:  Oral Oral Oral  SpO2: 95% 97% 98% 97%  Weight:      Height:        Intake/Output Summary (Last 24 hours) at 03/09/2021 1156 Last data filed at 03/09/2021 0400 Gross per 24 hour  Intake 898.12 ml  Output --  Net 898.12 ml   Filed Weights   03/08/21 1100  Weight: 52.6 kg    Examination:  General exam: Appears calm and comfortable  Respiratory system: Clear to auscultation. Respiratory effort normal. Cardiovascular system: S1 & S2 heard, RRR. No JVD, murmurs, rubs, gallops or clicks.  Gastrointestinal system: Abdomen is nondistended, soft and nontender.  Normal bowel sounds heard. Central nervous system: Alert and oriented. Grossly  intact Extremities:no edema Skin: warm, dry Psychiatry: Judgement and insight appear normal. Mood & affect appropriate.     Data Reviewed: I have personally reviewed following labs and imaging studies  CBC: Recent Labs  Lab 03/05/21 1139 03/08/21 1112 03/09/21 0533  WBC 5.7 6.1 3.4*  NEUTROABS  --  4.7  --   HGB 12.5 13.0 11.3*  HCT 36.5 39.0 32.8*  MCV 80.4 81.6 81.0  PLT 202 231 767   Basic Metabolic Panel: Recent Labs  Lab 03/05/21 1139 03/08/21 1112 03/09/21 0533  NA 130* 131* 135  K 5.2* 4.0 5.1  CL 97* 95* 105  CO2 20* 18* 21*  GLUCOSE 71 90 126*  BUN 39* 21 20  CREATININE 1.59* 1.40* 1.17*  CALCIUM 8.6* 8.9 8.0*   GFR: Estimated Creatinine Clearance: 30.3 mL/min (A) (by C-G formula based on SCr of 1.17 mg/dL (H)). Liver Function Tests: Recent Labs  Lab 03/05/21 1139 03/08/21 1112 03/09/21 0533  AST 25 32 18  ALT 10 12 10   ALKPHOS 45 54 46  BILITOT 1.1 2.0* 1.4*  PROT 7.1 7.7 5.9*  ALBUMIN 3.8 3.9 3.0*   Recent Labs  Lab 03/05/21 1139  LIPASE 31   No results for input(s): AMMONIA in the last 168 hours. Coagulation Profile: Recent Labs  Lab 03/08/21 1112  INR 1.1   Cardiac Enzymes: No results for input(s): CKTOTAL, CKMB, CKMBINDEX, TROPONINI  in the last 168 hours. BNP (last 3 results) No results for input(s): PROBNP in the last 8760 hours. HbA1C: No results for input(s): HGBA1C in the last 72 hours. CBG: No results for input(s): GLUCAP in the last 168 hours. Lipid Profile: No results for input(s): CHOL, HDL, LDLCALC, TRIG, CHOLHDL, LDLDIRECT in the last 72 hours. Thyroid Function Tests: Recent Labs    03/08/21 1452  TSH 0.107*  T4TOTAL 6.1  FREET4 0.75  T3FREE 1.1*   Anemia Panel: No results for input(s): VITAMINB12, FOLATE, FERRITIN, TIBC, IRON, RETICCTPCT in the last 72 hours. Sepsis Labs: Recent Labs  Lab 03/08/21 1112 03/08/21 1346  LATICACIDVEN 3.3* 1.3    Recent Results (from the past 240 hour(s))  Resp Panel by  RT-PCR (Flu A&B, Covid) Nasopharyngeal Swab     Status: Abnormal   Collection Time: 03/05/21  1:31 PM   Specimen: Nasopharyngeal Swab; Nasopharyngeal(NP) swabs in vial transport medium  Result Value Ref Range Status   SARS Coronavirus 2 by RT PCR POSITIVE (A) NEGATIVE Final    Comment: RESULT CALLED TO, READ BACK BY AND VERIFIED WITH: LINDA MCLAMB 03/05/21 1424 KLW (NOTE) SARS-CoV-2 target nucleic acids are DETECTED.  The SARS-CoV-2 RNA is generally detectable in upper respiratory specimens during the acute phase of infection. Positive results are indicative of the presence of the identified virus, but do not rule out bacterial infection or co-infection with other pathogens not detected by the test. Clinical correlation with patient history and other diagnostic information is necessary to determine patient infection status. The expected result is Negative.  Fact Sheet for Patients: EntrepreneurPulse.com.au  Fact Sheet for Healthcare Providers: IncredibleEmployment.be  This test is not yet approved or cleared by the [place] FDA and  has been authorized for detection and/or diagnosis of SARS-CoV-2 by FDA under an Emergency Use Authorization (EUA).  This EUA will remain in effect (meaning this test can be use d) for the duration of  the COVID-19 declaration under Section 564(b)(1) of the Act, 21 U.S.C. section 360bbb-3(b)(1), unless the authorization is terminated or revoked sooner.     Influenza A by PCR NEGATIVE NEGATIVE Final   Influenza B by PCR NEGATIVE NEGATIVE Final    Comment: (NOTE) The Xpert Xpress SARS-CoV-2/FLU/RSV plus assay is intended as an aid in the diagnosis of influenza from Nasopharyngeal swab specimens and should not be used as a sole basis for treatment. Nasal washings and aspirates are unacceptable for Xpert Xpress SARS-CoV-2/FLU/RSV testing.  Fact Sheet for  Patients: EntrepreneurPulse.com.au  Fact Sheet for Healthcare Providers: IncredibleEmployment.be  This test is not yet approved or cleared by the [place] FDA and has been authorized for detection and/or diagnosis of SARS-CoV-2 by FDA under an Emergency Use Authorization (EUA). This EUA will remain in effect (meaning this test can be used) for the duration of the COVID-19 declaration under Section 564(b)(1) of the Act, 21 U.S.C. section 360bbb-3(b)(1), unless the authorization is terminated or revoked.  Performed at [facility], [address] [place], [place] [address]   Culture, blood (Routine x 2)     Status: None (Preliminary result)   Collection Time: 03/08/21 11:59 AM   Specimen: BLOOD  Result Value Ref Range Status   Specimen Description BLOOD LEFT AC  Final   Special Requests   Final    BOTTLES DRAWN AEROBIC AND ANAEROBIC Blood Culture adequate volume   Culture   Final    NO GROWTH < 24 HOURS Performed at [facility], [place],  [place] [address]    Report Status PENDING  Incomplete  Culture, blood (Routine x 2)     Status: None (Preliminary result)   Collection Time: 03/08/21 12:00 PM   Specimen: BLOOD  Result Value Ref Range Status   Specimen Description BLOOD RIGHT AC  Final   Special Requests   Final    BOTTLES DRAWN AEROBIC AND ANAEROBIC Blood Culture adequate volume   Culture   Final    NO GROWTH < 24 HOURS Performed at [facility], [address], [place], [place] [address]    Report Status PENDING  Incomplete         Radiology Studies: DG Chest 2 View  Result Date: 03/08/2021 CLINICAL DATA:  Sepsis. EXAM: CHEST - 2 VIEW COMPARISON:  Mar 05, 2021. FINDINGS: The heart size and mediastinal contours are within normal limits. Both lungs are clear. The visualized skeletal structures are unremarkable. IMPRESSION: No active cardiopulmonary disease. Electronically Signed    By: Marijo Conception M.D.   On: 03/08/2021 12:22        Scheduled Meds: . enoxaparin (LOVENOX) injection  30 mg Subcutaneous Q24H  . hydrocortisone sod succinate (SOLU-CORTEF) inj  100 mg Intravenous Q8H  . levothyroxine  50 mcg Oral Daily   Continuous Infusions: . remdesivir 100 mg in NS 100 mL 100 mg (03/09/21 0937)    Assessment & Plan:   Active Problems:   Pituitary adenoma ([place])   Acquired hypothyroidism   Low serum cortisol level ([facility])   CKD (chronic kidney disease) stage 3, GFR 30-59 ml/min ([facility])   Hypothermia   AYEN VIVIANO is a 75 y.o. female with history of pituitary adenoma on chronic cortisol replacement, hypothyroidism, CKD stage III, who presents with fatigue and is found to have hypothermia in the setting of recent COVID diagnosis.  #COVID-19 Infection #Hypothermia #Adrenal Insufficiency  Patient presenting with true hypothermia confirmed on rectal temp down to 93 F.  Is almost certainly related to adrenal insufficiency, as patient reports that due to nausea she has been unable to take her cortisol for almost 1 week.  Has also been unable to take her Synthroid though suspect this is contributing to a much lesser degree.  Although she has not hypoxic she is having a significant illness in the setting of COVID, and therefore I will treat her with usual medications. 5/28 clinically starting to feels better. Only had liquids , trying to eat her diet to see if tolerates Continue hydrocortisone 100 mg IV every 8 hours She will need taper Continue IV remdesivir Maintain by hugger when needed to keep temperature regular I-S, flutter valve    Hypothyroidism  continue Synthroid TFT follow-up     Hyperlipidemia-  hold statins for now since on remdesivir   DVT prophylaxis: lovenox Code Status:full Family Communication: updated husband   Status is: Inpatient  Remains inpatient appropriate because:Inpatient level of care appropriate due to severity of  illness   Dispo: The patient is from: Home              Anticipated d/c is to: Home              Patient currently is not medically stable to d/c.   Difficult to place patient No            LOS: 1 day   Time spent: 35 min with >50% on coc    Nolberto Hanlon, MD [facility] Pager [phone]  If 7PM-7AM, please contact night-coverage 03/09/2021, 11:56  AM  

## 2021-03-10 DIAGNOSIS — N1831 Chronic kidney disease, stage 3a: Secondary | ICD-10-CM

## 2021-03-10 LAB — CBC
HCT: 30.4 % — ABNORMAL LOW (ref 36.0–46.0)
Hemoglobin: 10.4 g/dL — ABNORMAL LOW (ref 12.0–15.0)
MCH: 27.4 pg (ref 26.0–34.0)
MCHC: 34.2 g/dL (ref 30.0–36.0)
MCV: 80.2 fL (ref 80.0–100.0)
Platelets: 260 10*3/uL (ref 150–400)
RBC: 3.79 MIL/uL — ABNORMAL LOW (ref 3.87–5.11)
RDW: 13 % (ref 11.5–15.5)
WBC: 5.1 10*3/uL (ref 4.0–10.5)
nRBC: 0 % (ref 0.0–0.2)

## 2021-03-10 LAB — BASIC METABOLIC PANEL
Anion gap: 8 (ref 5–15)
BUN: 27 mg/dL — ABNORMAL HIGH (ref 8–23)
CO2: 22 mmol/L (ref 22–32)
Calcium: 8.2 mg/dL — ABNORMAL LOW (ref 8.9–10.3)
Chloride: 107 mmol/L (ref 98–111)
Creatinine, Ser: 1.34 mg/dL — ABNORMAL HIGH (ref 0.44–1.00)
GFR, Estimated: 42 mL/min — ABNORMAL LOW (ref >60)
Glucose, Bld: 145 mg/dL — ABNORMAL HIGH (ref 70–99)
Potassium: 4.7 mmol/L (ref 3.5–5.1)
Sodium: 137 mmol/L (ref 135–145)

## 2021-03-10 MED ORDER — HYDROCORTISONE 20 MG PO TABS: 10.0000 mg | ORAL_TABLET | Freq: Two times a day (BID) | ORAL | 0 refills | Status: AC

## 2021-03-10 MED ORDER — PANTOPRAZOLE SODIUM 40 MG PO TBEC: 40.0000 mg | DELAYED_RELEASE_TABLET | Freq: Every day | ORAL | 0 refills | Status: AC

## 2021-03-10 NOTE — Discharge Summary (Signed)
Brianna Hammond HQI:[id number] DOB: Sep 20, 1946 DOA: 03/08/2021  PCP: Brianna Body, MD  Admit date: 03/08/2021 Discharge date: 03/10/2021  Admitted From: home Disposition:  home  Recommendations for Outpatient Follow-up:  1. Follow up with PCP in 1 week 2. Please obtain BMP/CBC in one week      Discharge Condition:Stable CODE STATUS:full  Diet recommendation: Heart Healthy   Brief/Interim Summary: Per Brianna Hammond is a 75 y.o. female with history of pituitary adenoma on chronic cortisol replacement, hypothyroidism, CKD stage III, who presents with fatigue and weakness.Patient was seen in the [facility] ED 2 days prior and diagnosed with COVID. She reports she has been unable to take several of her medications for almost a week, including her cortisol and Synthroid. She reported that she had ongoing fatigue, nausea, and inability to take p.o., which prompted her to return to the ED Unable to eat or drink anything. In the ED initial vital signs notable for temperature of 92.6 Fahrenheit confirmed rectally, otherwise normal vital signs.  She was immediately placed under a Retail banker.  Lab work-up notable for lactic acid of 3.3 that down trended to 1.3, CMP with mild hyponatremia 131, creatinine at baseline of 1.4.  She was admitted to the hospital.  She was started on remdesivir and Solu-Cortef.  She received 3 doses of remdesivir.  She was given IV fluids for hydration.  She is tolerated her food has had no further diarrhea, nausea or vomiting.  She feels much better and feels that she can go home.    #COVID-19 Infection #Hypothermia #Adrenal Insufficiency Was placed on Bari hugger.  Started on Remdesivir which she received 3 doses. Was started on Solucortif iv , now will transition to po taper. Tolerated po intake . Was hydrated.    Hypothyroidism  continue Synthroid TSH is 0.107, Free T4 0.75     Hyperlipidemia- hold statins for now since on  remdesivir   Discharge Diagnoses:  Active Problems:   Pituitary adenoma (HCC)   Acquired hypothyroidism   Low serum cortisol level (HCC)   CKD (chronic kidney disease) stage 3, GFR 30-59 ml/min ([place])   Hypothermia    Discharge Instructions  Discharge Instructions    Call MD for:  temperature >100.4   Complete by: As directed    Diet - low sodium heart healthy   Complete by: As directed    Discharge instructions   Complete by: As directed    Follow up with pcp in one week Take your cortif as following taper: Take 20 mg po twice a day x3 days, then 10mg  po twice a day x3 days, then 5 mg po twice a day x1 day, then regular home dose (5mg  po daily) Do not take NSAIDS   Increase activity slowly   Complete by: As directed      Allergies as of 03/10/2021      Reactions   Dilaudid [hydromorphone Hcl] Anaphylaxis   Apnea/ required narcan in pacu to reverse   Dexamethasone Sodium Phosphate Hives   Ezetimibe Other (See Comments)   Muscle pain    Decadron [dexamethasone] Other (See Comments)   Flushing, hives   Latex Rash   And blisters      Medication List    TAKE these medications   calcium carbonate 500 MG chewable tablet Commonly known as: TUMS - dosed in mg elemental calcium Chew 1 tablet by mouth daily as needed for indigestion or heartburn.   CALCIUM CITRATE + D3 PO Take 1 tablet by mouth  daily.   hydrocortisone 20 MG tablet Commonly known as: CORTEF Take 0.5 tablets (10 mg total) by mouth 2 (two) times daily for 13 doses. What changed:   medication strength  how much to take  when to take this   levothyroxine 50 MCG tablet Commonly known as: SYNTHROID Take 50 mcg by mouth daily.   ondansetron 4 MG disintegrating tablet Commonly known as: Zofran ODT Take 1 tablet (4 mg total) by mouth every 8 (eight) hours as needed for nausea or vomiting.   pantoprazole 40 MG tablet Commonly known as: Protonix Take 1 tablet (40 mg total) by mouth daily.    pravastatin 10 MG tablet Commonly known as: PRAVACHOL Take 10 mg by mouth daily.   vitamin B-12 1000 MCG tablet Commonly known as: CYANOCOBALAMIN Take 1,000 mcg by mouth daily.       Follow-up Information    Brianna Body, MD In 1 week.   Specialty: Family Medicine Why: Patient to make own follow up appt Contact information: [place] [address] [phone]              Allergies  Allergen Reactions  . Dilaudid [Hydromorphone Hcl] Anaphylaxis    Apnea/ required narcan in pacu to reverse  . Dexamethasone Sodium Phosphate Hives  . Ezetimibe Other (See Comments)    Muscle pain   . Decadron [Dexamethasone] Other (See Comments)    Flushing, hives  . Latex Rash    And blisters    Consultations:     Procedures/Studies: DG Chest 2 View  Result Date: 03/08/2021 CLINICAL DATA:  Sepsis. EXAM: CHEST - 2 VIEW COMPARISON:  Mar 05, 2021. FINDINGS: The heart size and mediastinal contours are within normal limits. Both lungs are clear. The visualized skeletal structures are unremarkable. IMPRESSION: No active cardiopulmonary disease. Electronically Signed   By: Marijo Conception M.D.   On: 03/08/2021 12:22   DG Chest Portable 1 View  Result Date: 03/05/2021 CLINICAL DATA:  Cough EXAM: PORTABLE CHEST 1 VIEW COMPARISON:  None. FINDINGS: Slight interstitial prominence is likely chronic. No consolidation. No pleural effusion. No pneumothorax. Cardiomediastinal contours are within normal limits. IMPRESSION: No acute process in the chest. Electronically Signed   By: Macy Mis M.D.   On: 03/05/2021 14:08      Subjective: Feels better. Ready to go home. No n/v/abd pain or sob. Tolerated po intake  Discharge Exam: Vitals:   03/10/21 0427 03/10/21 0838  BP: 121/80 133/71  Pulse: 78 65  Resp: 16 14  Temp: 97.9 F (36.6 C) (!) 97.5 F (36.4 C)  SpO2: 97% 98%   Vitals:   03/09/21 2001 03/09/21 2358 03/10/21 0427 03/10/21  0838  BP: 117/73 112/63 121/80 133/71  Pulse: 68 64 78 65  Resp: 16 16 16 14   Temp: 98.1 F (36.7 C) 97.9 F (36.6 C) 97.9 F (36.6 C) (!) 97.5 F (36.4 C)  TempSrc: Oral Oral Oral Oral  SpO2: 96% 97% 97% 98%  Weight:      Height:        General: Pt is alert, awake, not in acute distress Cardiovascular: RRR, S1/S2 +, no rubs, no gallops Respiratory: CTA bilaterally, no wheezing, no rhonchi Abdominal: Soft, NT, ND, bowel sounds + Extremities: no edema    The results of significant diagnostics from this hospitalization (including imaging, microbiology, ancillary and laboratory) are listed below for reference.     Microbiology: Recent Results (from the past 240 hour(s))  Resp Panel by RT-PCR (  Flu A&B, Covid) Nasopharyngeal Swab     Status: Abnormal   Collection Time: 03/05/21  1:31 PM   Specimen: Nasopharyngeal Swab; Nasopharyngeal(NP) swabs in vial transport medium  Result Value Ref Range Status   SARS Coronavirus 2 by RT PCR POSITIVE (A) NEGATIVE Final    Comment: RESULT CALLED TO, READ BACK BY AND VERIFIED WITH: LINDA MCLAMB 03/05/21 1424 KLW (NOTE) SARS-CoV-2 target nucleic acids are DETECTED.  The SARS-CoV-2 RNA is generally detectable in upper respiratory specimens during the acute phase of infection. Positive results are indicative of the presence of the identified virus, but do not rule out bacterial infection or co-infection with other pathogens not detected by the test. Clinical correlation with patient history and other diagnostic information is necessary to determine patient infection status. The expected result is Negative.  Fact Sheet for Patients: EntrepreneurPulse.com.au  Fact Sheet for Healthcare Providers: IncredibleEmployment.be  This test is not yet approved or cleared by the [place] FDA and  has been authorized for detection and/or diagnosis of SARS-CoV-2 by FDA under an Emergency Use Authorization (EUA).   This EUA will remain in effect (meaning this test can be use d) for the duration of  the COVID-19 declaration under Section 564(b)(1) of the Act, 21 U.S.C. section 360bbb-3(b)(1), unless the authorization is terminated or revoked sooner.     Influenza A by PCR NEGATIVE NEGATIVE Final   Influenza B by PCR NEGATIVE NEGATIVE Final    Comment: (NOTE) The Xpert Xpress SARS-CoV-2/FLU/RSV plus assay is intended as an aid in the diagnosis of influenza from Nasopharyngeal swab specimens and should not be used as a sole basis for treatment. Nasal washings and aspirates are unacceptable for Xpert Xpress SARS-CoV-2/FLU/RSV testing.  Fact Sheet for Patients: EntrepreneurPulse.com.au  Fact Sheet for Healthcare Providers: IncredibleEmployment.be  This test is not yet approved or cleared by the [place] FDA and has been authorized for detection and/or diagnosis of SARS-CoV-2 by FDA under an Emergency Use Authorization (EUA). This EUA will remain in effect (meaning this test can be used) for the duration of the COVID-19 declaration under Section 564(b)(1) of the Act, 21 U.S.C. section 360bbb-3(b)(1), unless the authorization is terminated or revoked.  Performed at [facility], [address] [place], [place] [address]   Culture, blood (Routine x 2)     Status: None (Preliminary result)   Collection Time: 03/08/21 11:59 AM   Specimen: BLOOD  Result Value Ref Range Status   Specimen Description BLOOD LEFT [facility]  Final   Special Requests   Final    BOTTLES DRAWN AEROBIC AND ANAEROBIC Blood Culture adequate volume   Culture   Final    NO GROWTH 2 DAYS Performed at [facility], [address], [place], [place] [address]    Report Status PENDING  Incomplete  Culture, blood (Routine x 2)     Status: None (Preliminary result)   Collection Time: 03/08/21 12:00 PM   Specimen: BLOOD  Result Value Ref Range Status   Specimen Description  BLOOD RIGHT [facility]  Final   Special Requests   Final    BOTTLES DRAWN AEROBIC AND ANAEROBIC Blood Culture adequate volume   Culture   Final    NO GROWTH 2 DAYS Performed at [facility], [address], [place], [place] [address]    Report Status PENDING  Incomplete     Labs: BNP (last 3 results) No results for input(s): BNP in the last 8760 hours. Basic Metabolic Panel: Recent Labs  Lab 03/05/21  1139 03/08/21 1112 03/09/21 0533 03/10/21 0421  NA 130* 131* 135 137  K 5.2* 4.0 5.1 4.7  CL 97* 95* 105 107  CO2 20* 18* 21* 22  GLUCOSE 71 90 126* 145*  BUN 39* 21 20 27*  CREATININE 1.59* 1.40* 1.17* 1.34*  CALCIUM 8.6* 8.9 8.0* 8.2*   Liver Function Tests: Recent Labs  Lab 03/05/21 1139 03/08/21 1112 03/09/21 0533  AST 25 32 18  ALT 10 12 10   ALKPHOS 45 54 46  BILITOT 1.1 2.0* 1.4*  PROT 7.1 7.7 5.9*  ALBUMIN 3.8 3.9 3.0*   Recent Labs  Lab 03/05/21 1139  LIPASE 31   No results for input(s): AMMONIA in the last 168 hours. CBC: Recent Labs  Lab 03/05/21 1139 03/08/21 1112 03/09/21 0533 03/10/21 0421  WBC 5.7 6.1 3.4* 5.1  NEUTROABS  --  4.7  --   --   HGB 12.5 13.0 11.3* 10.4*  HCT 36.5 39.0 32.8* 30.4*  MCV 80.4 81.6 81.0 80.2  PLT 202 231 200 260   Cardiac Enzymes: No results for input(s): CKTOTAL, CKMB, CKMBINDEX, TROPONINI in the last 168 hours. BNP: Invalid input(s): POCBNP CBG: No results for input(s): GLUCAP in the last 168 hours. D-Dimer No results for input(s): DDIMER in the last 72 hours. Hgb A1c No results for input(s): HGBA1C in the last 72 hours. Lipid Profile No results for input(s): CHOL, HDL, LDLCALC, TRIG, CHOLHDL, LDLDIRECT in the last 72 hours. Thyroid function studies Recent Labs    03/08/21 1452  TSH 0.107*  T4TOTAL 6.1  T3FREE 1.1*   Anemia work up No results for input(s): VITAMINB12, FOLATE, FERRITIN, TIBC, IRON, RETICCTPCT in the last 72 hours. Urinalysis    Component Value Date/Time   COLORURINE YELLOW  (A) 03/08/2021 1657   APPEARANCEUR CLEAR (A) 03/08/2021 1657   APPEARANCEUR Clear 11/22/2020 1113   LABSPEC 1.015 03/08/2021 1657   PHURINE 5.0 03/08/2021 1657   GLUCOSEU NEGATIVE 03/08/2021 1657   HGBUR MODERATE (A) 03/08/2021 1657   BILIRUBINUR NEGATIVE 03/08/2021 1657   BILIRUBINUR Negative 11/22/2020 1113   KETONESUR 20 (A) 03/08/2021 1657   PROTEINUR NEGATIVE 03/08/2021 1657   NITRITE NEGATIVE 03/08/2021 1657   LEUKOCYTESUR NEGATIVE 03/08/2021 1657   Sepsis Labs Invalid input(s): PROCALCITONIN,  WBC,  LACTICIDVEN Microbiology Recent Results (from the past 240 hour(s))  Resp Panel by RT-PCR (Flu A&B, Covid) Nasopharyngeal Swab     Status: Abnormal   Collection Time: 03/05/21  1:31 PM   Specimen: Nasopharyngeal Swab; Nasopharyngeal(NP) swabs in vial transport medium  Result Value Ref Range Status   SARS Coronavirus 2 by RT PCR POSITIVE (A) NEGATIVE Final    Comment: RESULT CALLED TO, READ BACK BY AND VERIFIED WITH: LINDA MCLAMB 03/05/21 1424 KLW (NOTE) SARS-CoV-2 target nucleic acids are DETECTED.  The SARS-CoV-2 RNA is generally detectable in upper respiratory specimens during the acute phase of infection. Positive results are indicative of the presence of the identified virus, but do not rule out bacterial infection or co-infection with other pathogens not detected by the test. Clinical correlation with patient history and other diagnostic information is necessary to determine patient infection status. The expected result is Negative.  Fact Sheet for Patients: EntrepreneurPulse.com.au  Fact Sheet for Healthcare Providers: IncredibleEmployment.be  This test is not yet approved or cleared by the [place] FDA and  has been authorized for detection and/or diagnosis of SARS-CoV-2 by FDA under an Emergency Use Authorization (EUA).  This EUA will remain in effect (meaning this test can be  use d) for the duration of  the COVID-19  declaration under Section 564(b)(1) of the Act, 21 U.S.C. section 360bbb-3(b)(1), unless the authorization is terminated or revoked sooner.     Influenza A by PCR NEGATIVE NEGATIVE Final   Influenza B by PCR NEGATIVE NEGATIVE Final    Comment: (NOTE) The Xpert Xpress SARS-CoV-2/FLU/RSV plus assay is intended as an aid in the diagnosis of influenza from Nasopharyngeal swab specimens and should not be used as a sole basis for treatment. Nasal washings and aspirates are unacceptable for Xpert Xpress SARS-CoV-2/FLU/RSV testing.  Fact Sheet for Patients: EntrepreneurPulse.com.au  Fact Sheet for Healthcare Providers: IncredibleEmployment.be  This test is not yet approved or cleared by the [place] FDA and has been authorized for detection and/or diagnosis of SARS-CoV-2 by FDA under an Emergency Use Authorization (EUA). This EUA will remain in effect (meaning this test can be used) for the duration of the COVID-19 declaration under Section 564(b)(1) of the Act, 21 U.S.C. section 360bbb-3(b)(1), unless the authorization is terminated or revoked.  Performed at [facility], [address] [place], [place] [address]   Culture, blood (Routine x 2)     Status: None (Preliminary result)   Collection Time: 03/08/21 11:59 AM   Specimen: BLOOD  Result Value Ref Range Status   Specimen Description BLOOD LEFT [facility]  Final   Special Requests   Final    BOTTLES DRAWN AEROBIC AND ANAEROBIC Blood Culture adequate volume   Culture   Final    NO GROWTH 2 DAYS Performed at [facility], [address], [place], [place] [address]    Report Status PENDING  Incomplete  Culture, blood (Routine x 2)     Status: None (Preliminary result)   Collection Time: 03/08/21 12:00 PM   Specimen: BLOOD  Result Value Ref Range Status   Specimen Description BLOOD RIGHT [facility]  Final   Special Requests   Final    BOTTLES DRAWN AEROBIC AND ANAEROBIC Blood  Culture adequate volume   Culture   Final    NO GROWTH 2 DAYS Performed at [facility], [address] [place], [place] [address]    Report Status PENDING  Incomplete     Time coordinating discharge: Over 30 minutes  SIGNED:   Nolberto Hanlon, MD  [facility] 03/10/2021, 11:50 AM Pager   If 7PM-7AM, please contact night-coverage [email or web address] Password [email or web address]

## 2021-03-13 LAB — CULTURE, BLOOD (ROUTINE X 2)
Culture: NO GROWTH
Culture: NO GROWTH
Special Requests: ADEQUATE
Special Requests: ADEQUATE

## 2021-03-29 DIAGNOSIS — Z09 Encounter for follow-up examination after completed treatment for conditions other than malignant neoplasm: Secondary | ICD-10-CM | POA: Diagnosis not present

## 2021-03-29 DIAGNOSIS — Z8616 Personal history of COVID-19: Secondary | ICD-10-CM | POA: Diagnosis not present

## 2021-03-29 DIAGNOSIS — N1831 Chronic kidney disease, stage 3a: Secondary | ICD-10-CM | POA: Diagnosis not present

## 2021-04-10 DIAGNOSIS — H179 Unspecified corneal scar and opacity: Secondary | ICD-10-CM | POA: Diagnosis not present

## 2021-04-16 ENCOUNTER — Other Ambulatory Visit: Payer: Self-pay | Admitting: Family Medicine

## 2021-04-16 DIAGNOSIS — Z1231 Encounter for screening mammogram for malignant neoplasm of breast: Secondary | ICD-10-CM

## 2021-05-21 ENCOUNTER — Other Ambulatory Visit: Payer: Self-pay | Admitting: Neurosurgery

## 2021-05-21 DIAGNOSIS — D497 Neoplasm of unspecified behavior of endocrine glands and other parts of nervous system: Secondary | ICD-10-CM

## 2021-06-07 ENCOUNTER — Other Ambulatory Visit: Payer: Self-pay | Admitting: Radiation Therapy

## 2021-06-07 DIAGNOSIS — D485 Neoplasm of uncertain behavior of skin: Secondary | ICD-10-CM | POA: Diagnosis not present

## 2021-06-07 DIAGNOSIS — L570 Actinic keratosis: Secondary | ICD-10-CM | POA: Diagnosis not present

## 2021-06-18 DIAGNOSIS — E230 Hypopituitarism: Secondary | ICD-10-CM | POA: Diagnosis not present

## 2021-06-18 DIAGNOSIS — L659 Nonscarring hair loss, unspecified: Secondary | ICD-10-CM | POA: Diagnosis not present

## 2021-06-18 DIAGNOSIS — E039 Hypothyroidism, unspecified: Secondary | ICD-10-CM | POA: Diagnosis not present

## 2021-06-18 DIAGNOSIS — E2749 Other adrenocortical insufficiency: Secondary | ICD-10-CM | POA: Diagnosis not present

## 2021-06-18 DIAGNOSIS — D497 Neoplasm of unspecified behavior of endocrine glands and other parts of nervous system: Secondary | ICD-10-CM | POA: Diagnosis not present

## 2021-07-01 ENCOUNTER — Other Ambulatory Visit: Payer: Self-pay

## 2021-07-01 ENCOUNTER — Ambulatory Visit
Admission: RE | Admit: 2021-07-01 | Discharge: 2021-07-01 | Disposition: A | Discharge status: Home / Self Care | Payer: PPO | Source: Ambulatory Visit | Attending: Neurosurgery | Admitting: Neurosurgery

## 2021-07-01 DIAGNOSIS — D496 Neoplasm of unspecified behavior of brain: Secondary | ICD-10-CM | POA: Diagnosis not present

## 2021-07-01 DIAGNOSIS — D497 Neoplasm of unspecified behavior of endocrine glands and other parts of nervous system: Secondary | ICD-10-CM

## 2021-07-01 DIAGNOSIS — D352 Benign neoplasm of pituitary gland: Secondary | ICD-10-CM | POA: Diagnosis not present

## 2021-07-01 MED ORDER — GADOBENATE DIMEGLUMINE 529 MG/ML IV SOLN
10.0000 mL | Freq: Once | INTRAVENOUS | Status: AC | PRN
  Administered 2021-07-01: 10 mL via INTRAVENOUS

## 2021-07-05 DIAGNOSIS — D497 Neoplasm of unspecified behavior of endocrine glands and other parts of nervous system: Secondary | ICD-10-CM | POA: Diagnosis not present

## 2021-07-05 DIAGNOSIS — R030 Elevated blood-pressure reading, without diagnosis of hypertension: Secondary | ICD-10-CM | POA: Diagnosis not present

## 2021-07-08 ENCOUNTER — Inpatient Hospital Stay: Payer: PPO | Attending: Internal Medicine

## 2021-07-11 ENCOUNTER — Other Ambulatory Visit: Payer: Self-pay | Admitting: Neurological Surgery

## 2021-07-11 DIAGNOSIS — D497 Neoplasm of unspecified behavior of endocrine glands and other parts of nervous system: Secondary | ICD-10-CM

## 2021-07-17 ENCOUNTER — Other Ambulatory Visit: Payer: Self-pay

## 2021-07-17 ENCOUNTER — Ambulatory Visit
Admission: RE | Admit: 2021-07-17 | Discharge: 2021-07-17 | Disposition: A | Discharge status: Home / Self Care | Payer: PPO | Source: Ambulatory Visit | Attending: Urology | Admitting: Urology

## 2021-07-17 DIAGNOSIS — Z905 Acquired absence of kidney: Secondary | ICD-10-CM | POA: Insufficient documentation

## 2021-07-17 DIAGNOSIS — R3121 Asymptomatic microscopic hematuria: Secondary | ICD-10-CM | POA: Diagnosis not present

## 2021-07-17 DIAGNOSIS — Z87442 Personal history of urinary calculi: Secondary | ICD-10-CM

## 2021-07-17 DIAGNOSIS — N200 Calculus of kidney: Secondary | ICD-10-CM | POA: Diagnosis not present

## 2021-07-17 DIAGNOSIS — N3289 Other specified disorders of bladder: Secondary | ICD-10-CM | POA: Diagnosis not present

## 2021-07-18 ENCOUNTER — Ambulatory Visit: Payer: PPO | Admitting: Urology

## 2021-07-18 ENCOUNTER — Encounter: Payer: Self-pay | Admitting: Urology

## 2021-07-18 VITALS — BP 115/78 | HR 62 | Ht 61.0 in | Wt 120.0 lb

## 2021-07-18 DIAGNOSIS — Z905 Acquired absence of kidney: Secondary | ICD-10-CM

## 2021-07-18 DIAGNOSIS — N200 Calculus of kidney: Secondary | ICD-10-CM | POA: Diagnosis not present

## 2021-07-18 DIAGNOSIS — R3121 Asymptomatic microscopic hematuria: Secondary | ICD-10-CM | POA: Diagnosis not present

## 2021-07-18 NOTE — Progress Notes (Signed)
   07/18/2021 8:58 AM   Brianna Hammond 12/09/1945 [id number]  Reason for visit: Follow up solitary kidney, nephrolithiasis, history of microscopic hematuria  HPI: She is a 75 year old female with a history of a left radical nephrectomy for an AML in 2004, as well as a history of right-sided nephrolithiasis in 2005.  She has had no stones since that time.  She also has a 20+ year history of low-grade microscopic hematuria, and has deferred further evaluation with cystoscopy and CT urogram multiple times, and we have discussed the risks and benefits as well as low, but nonzero, risk of missing additional pathology.  She had a UTI in January 2022 that was treated by [facility], but this was her first UTI in 10+ years.  She denies any gross hematuria or stone events.  She was hospitalized in May for COVID, but is feeling better.  I reviewed those inpatient notes.  Her husband underwent a HOLEP last year and continues to do very well.   I personally reviewed her renal ultrasound today which shows a solitary right kidney with no evidence of hydronephrosis or nephrolithiasis.  Formal radiology read pending   We discussed prevention strategies for UTI including cranberry tablet prophylaxis and behavioral strategies.  Return precautions discussed regarding any right-sided flank pain or gross hematuria in the setting of her solitary kidney.  She would like to continue yearly renal ultrasounds in the setting of her history of stones and a solitary kidney.  RTC 1 year with renal ultrasound prior   Billey Co, MD  [facility] [address], [place], [place] [address] [phone]

## 2021-07-18 NOTE — Patient Instructions (Signed)
Dietary Guidelines to Help Prevent Kidney Stones Kidney stones are deposits of minerals and salts that form inside your kidneys. Your risk of developing kidney stones may be greater depending on your diet, your lifestyle, the medicines you take, and whether you have certain medical conditions. Most people can lower their chances of developing kidney stones by following the instructions below. Your dietitian may give you more specific instructions depending on your overall health and the type of kidney stones you tend to develop. What are tips for following this plan? Reading food labels  Choose foods with "no salt added" or "low-salt" labels. Limit your salt (sodium) intake to less than 1,500 mg a day. Choose foods with calcium for each meal and snack. Try to eat about 300 mg of calcium at each meal. Foods that contain 200-500 mg of calcium a serving include: 8 oz (237 mL) of milk, calcium-fortifiednon-dairy milk, and calcium-fortifiedfruit juice. Calcium-fortified means that calcium has been added to these drinks. 8 oz (237 mL) of kefir, yogurt, and soy yogurt. 4 oz (114 g) of tofu. 1 oz (28 g) of cheese. 1 cup (150 g) of dried figs. 1 cup (91 g) of cooked broccoli. One 3 oz (85 g) can of sardines or mackerel. Most people need 1,000-1,500 mg of calcium a day. Talk to your dietitian about how much calcium is recommended for you. Shopping Buy plenty of fresh fruits and vegetables. Most people do not need to avoid fruits and vegetables, even if these foods contain nutrients that may contribute to kidney stones. When shopping for convenience foods, choose: Whole pieces of fruit. Pre-made salads with dressing on the side. Low-fat fruit and yogurt smoothies. Avoid buying frozen meals or prepared deli foods. These can be high in sodium. Look for foods with live cultures, such as yogurt and kefir. Choose high-fiber grains, such as whole-wheat breads, oat bran, and wheat cereals. Cooking Do not add  salt to food when cooking. Place a salt shaker on the table and allow each person to add his or her own salt to taste. Use vegetable protein, such as beans, textured vegetable protein (TVP), or tofu, instead of meat in pasta, casseroles, and soups. Meal planning Eat less salt, if told by your dietitian. To do this: Avoid eating processed or pre-made food. Avoid eating fast food. Eat less animal protein, including cheese, meat, poultry, or fish, if told by your dietitian. To do this: Limit the number of times you have meat, poultry, fish, or cheese each week. Eat a diet free of meat at least 2 days a week. Eat only one serving each day of meat, poultry, fish, or seafood. When you prepare animal protein, cut pieces into small portion sizes. For most meat and fish, one serving is about the size of the palm of your hand. Eat at least five servings of fresh fruits and vegetables each day. To do this: Keep fruits and vegetables on hand for snacks. Eat one piece of fruit or a handful of berries with breakfast. Have a salad and fruit at lunch. Have two kinds of vegetables at dinner. Limit foods that are high in a substance called oxalate. These include: Spinach (cooked), rhubarb, beets, sweet potatoes, and Swiss chard. Peanuts. Potato chips, french fries, and baked potatoes with skin on. Nuts and nut products. Chocolate. If you regularly take a diuretic medicine, make sure to eat at least 1 or 2 servings of fruits or vegetables that are high in potassium each day. These include: Avocado. Banana. Orange, prune,   carrot, or tomato juice. Baked potato. Cabbage. Beans and split peas. Lifestyle  Drink enough fluid to keep your urine pale yellow. This is the most important thing you can do. Spread your fluid intake throughout the day. If you drink alcohol: Limit how much you use to: 0-1 drink a day for women who are not pregnant. 0-2 drinks a day for men. Be aware of how much alcohol is in your  drink. In the [place] one drink equals one 12 oz bottle of beer (355 mL), one 5 oz glass of wine (148 mL), or one 1 oz glass of hard liquor (44 mL). Lose weight if told by your health care provider. Work with your dietitian to find an eating plan and weight loss strategies that work best for you. General information Talk to your health care provider and dietitian about taking daily supplements. You may be told the following depending on your health and the cause of your kidney stones: Not to take supplements with vitamin C. To take a calcium supplement. To take a daily probiotic supplement. To take other supplements such as magnesium, fish oil, or vitamin B6. Take over-the-counter and prescription medicines only as told by your health care provider. These include supplements. What foods should I limit? Limit your intake of the following foods, or eat them as told by your dietitian. Vegetables Spinach. Rhubarb. Beets. Canned vegetables. Pickles. Olives. Baked potatoes with skin. Grains Wheat bran. Baked goods. Salted crackers. Cereals high in sugar. Meats and other proteins Nuts. Nut butters. Large portions of meat, poultry, or fish. Salted, precooked, or cured meats, such as sausages, meat loaves, and hot dogs. Dairy Cheese. Beverages Regular soft drinks. Regular vegetable juice. Seasonings and condiments Seasoning blends with salt. Salad dressings. Soy sauce. Ketchup. Barbecue sauce. Other foods Canned soups. Canned pasta sauce. Casseroles. Pizza. Lasagna. Frozen meals. Potato chips. French fries. The items listed above may not be a complete list of foods and beverages you should limit. Contact a dietitian for more information. What foods should I avoid? Talk to your dietitian about specific foods you should avoid based on the type of kidney stones you have and your overall health. Fruits Grapefruit. The item listed above may not be a complete list of foods and beverages you should  avoid. Contact a dietitian for more information. Summary Kidney stones are deposits of minerals and salts that form inside your kidneys. You can lower your risk of kidney stones by making changes to your diet. The most important thing you can do is drink enough fluid. Drink enough fluid to keep your urine pale yellow. Talk to your dietitian about how much calcium you should have each day, and eat less salt and animal protein as told by your dietitian. This information is not intended to replace advice given to you by your health care provider. Make sure you discuss any questions you have with your health care provider. Document Revised: 09/22/2019 Document Reviewed: 09/22/2019 Elsevier Patient Education  2022 Elsevier Inc.  

## 2021-07-18 NOTE — Addendum Note (Signed)
Addended by: Despina Hidden on: 07/18/2021 09:05 AM   Modules accepted: Orders

## 2021-07-19 ENCOUNTER — Other Ambulatory Visit: Payer: Self-pay | Admitting: Radiation Therapy

## 2021-07-23 ENCOUNTER — Ambulatory Visit
Admission: RE | Admit: 2021-07-23 | Discharge: 2021-07-23 | Disposition: A | Discharge status: Home / Self Care | Payer: PPO | Source: Ambulatory Visit | Attending: Family Medicine | Admitting: Family Medicine

## 2021-07-23 ENCOUNTER — Other Ambulatory Visit: Payer: Self-pay

## 2021-07-23 DIAGNOSIS — Z1231 Encounter for screening mammogram for malignant neoplasm of breast: Secondary | ICD-10-CM | POA: Insufficient documentation

## 2021-07-31 DIAGNOSIS — N1831 Chronic kidney disease, stage 3a: Secondary | ICD-10-CM | POA: Diagnosis not present

## 2021-07-31 DIAGNOSIS — E7800 Pure hypercholesterolemia, unspecified: Secondary | ICD-10-CM | POA: Diagnosis not present

## 2021-08-07 DIAGNOSIS — Z0000 Encounter for general adult medical examination without abnormal findings: Secondary | ICD-10-CM | POA: Diagnosis not present

## 2021-08-07 DIAGNOSIS — D72819 Decreased white blood cell count, unspecified: Secondary | ICD-10-CM | POA: Diagnosis not present

## 2021-08-07 DIAGNOSIS — N1831 Chronic kidney disease, stage 3a: Secondary | ICD-10-CM | POA: Diagnosis not present

## 2021-08-07 DIAGNOSIS — E7800 Pure hypercholesterolemia, unspecified: Secondary | ICD-10-CM | POA: Diagnosis not present

## 2021-08-09 NOTE — Telephone Encounter (Signed)
Low-dose aspirin okay, would continue to avoid NSAIDs

## 2021-10-24 IMAGING — MR MR HEAD WO/W CM
22 series · 48 of 48 positions shown · IV contrast (10 mL Multihance)
Comparison: 12/23/2018

CLINICAL DATA: Status post SRS therapy to pituitary 7879

EXAM:
MRI HEAD WITHOUT AND WITH CONTRAST
TECHNIQUE: Multiplanar, multiecho pulse sequences of the brain and surrounding
structures were obtained without and with intravenous contrast.
CONTRAST:  10mL MULTIHANCE GADOBENATE DIMEGLUMINE 529 MG/ML IV SOLN

[Series 5: T1 · sagittal · 4.0mm · 0.72mm/px · 1 of 28 slices shown (1 of 5)]
[im 1/28]
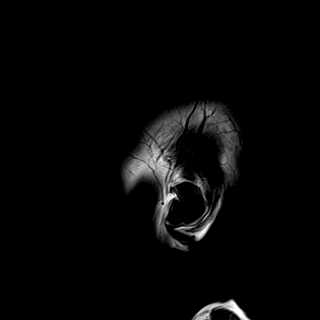

[Series 6: DWI · axial · 3.0mm · 0.94mm/px · z∈[-43,+97]mm · 8 of 159 slices shown]
[im 1/159]
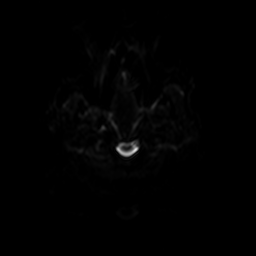
[im 23/159]
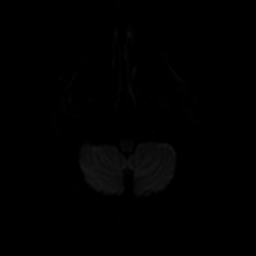
[im 46/159]
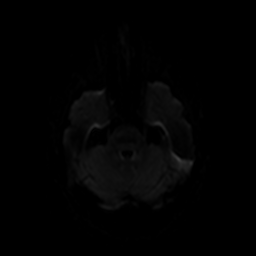
[im 68/159]
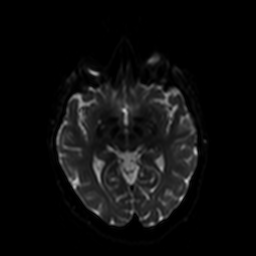
[im 91/159]
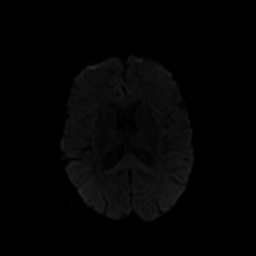
[im 113/159]
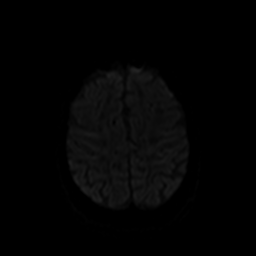
[im 136/159]
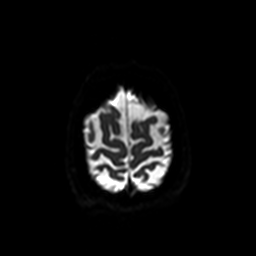
[im 159/159]
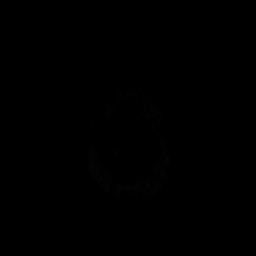

[Series 7: ax dwi_tracew · axial · 3.0mm · 0.94mm/px · z∈[-43,+97]mm · 4 of 80 slices shown]
[im 1/80]
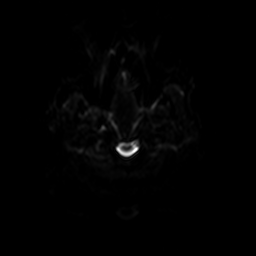
[im 27/80]
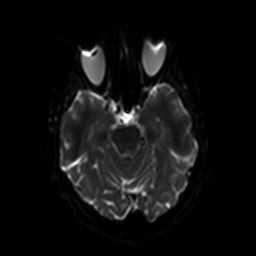
[im 53/80]
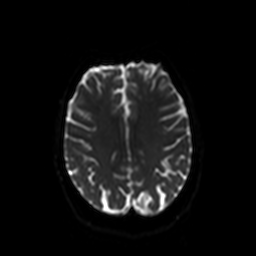
[im 80/80]
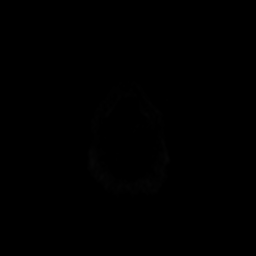

[Series 8: ax dwi_adc · axial · 3.0mm · 0.94mm/px · 1 of 40 slices shown]
[im 1/40]
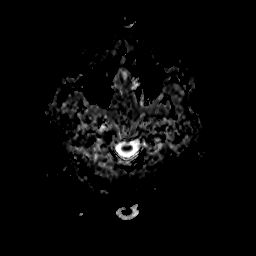

[Series 9: T2 · axial · 4.0mm · 0.36mm/px · 1 of 27 slices shown (1 of 2)]
[im 1/27]
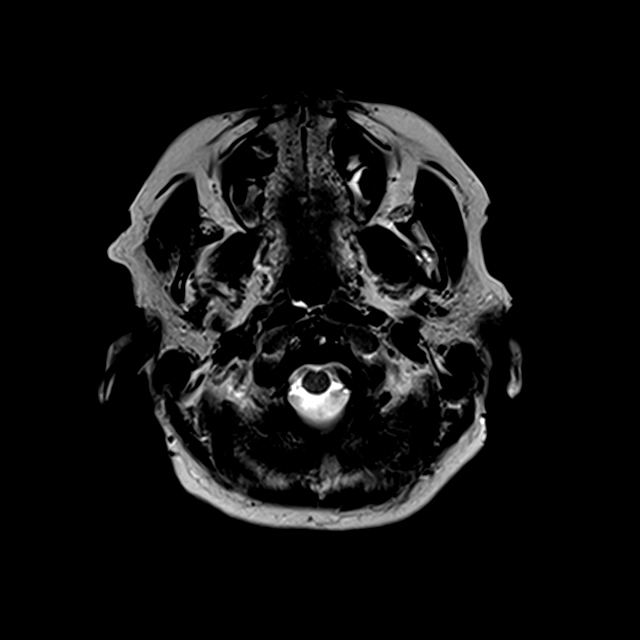

[Series 10: FLAIR · axial · 5.0mm · 0.57mm/px · 1 of 27 slices shown]
[im 1/27]
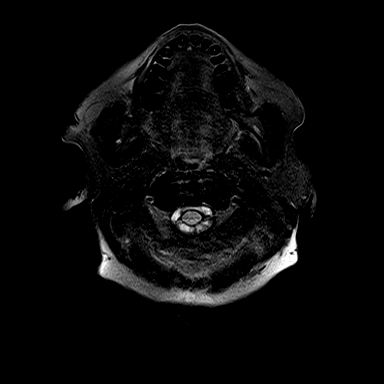

[Series 11: GRE · axial · 5.0mm · 0.69mm/px · 1 of 27 slices shown]
[im 1/27]
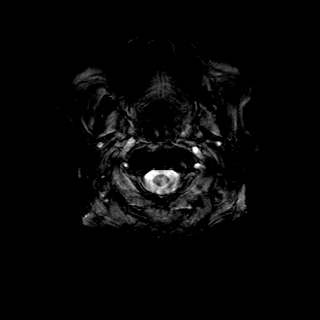

[Series 12: T1 · axial · 1.0mm · 0.69mm/px · z∈[-68,+90]mm · 8 of 160 slices shown (2 of 5)]
[im 1/160]
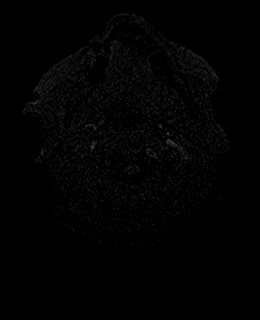
[im 23/160]
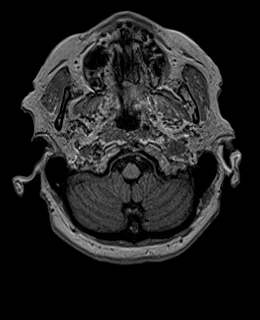
[im 46/160]
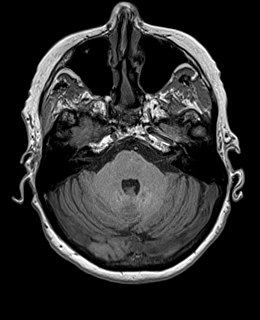
[im 69/160]
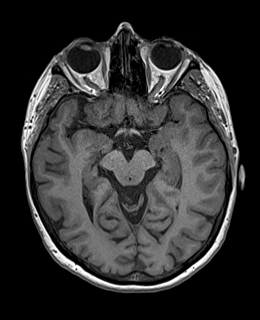
[im 91/160]
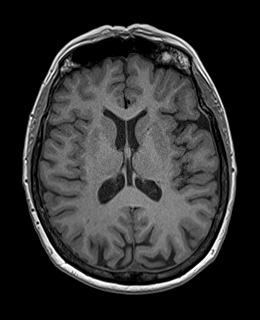
[im 114/160]
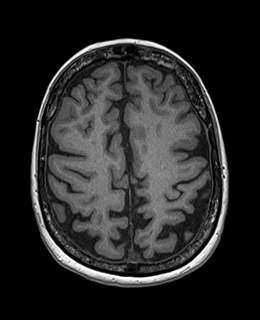
[im 137/160]
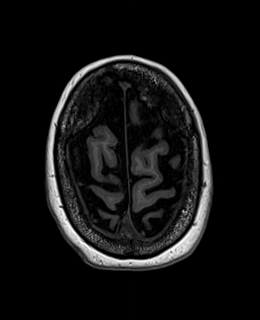
[im 160/160]
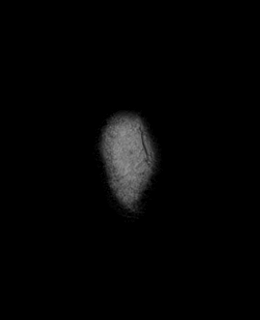

[Series 13: T1 · sagittal · 3.0mm · 0.42mm/px · 1 of 13 slices shown (3 of 5)]
[im 1/13]
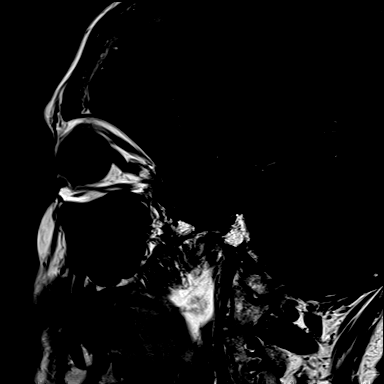

[Series 14: T1 · coronal · 3.0mm · 0.42mm/px · 1 of 10 slices shown (4 of 5)]
[im 1/10]
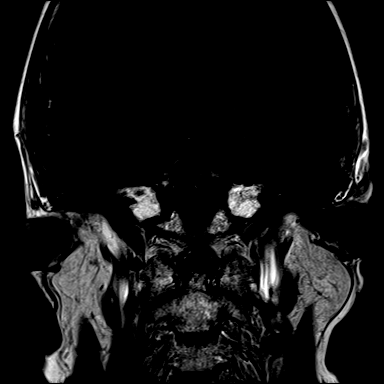

[Series 15: T2 · coronal · 3.0mm · 0.42mm/px · 1 of 10 slices shown (2 of 2)]
[im 1/10]
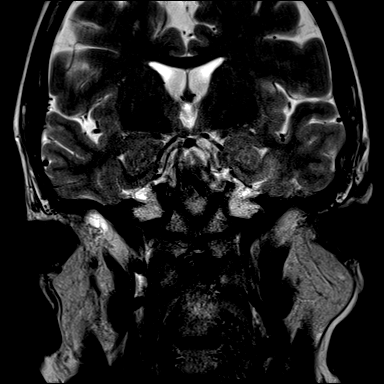

[Series 16: T1 · coronal · non-contrast · 3.0mm · 0.62mm/px · 1 of 9 slices shown (5 of 5)]
[im 1/9]
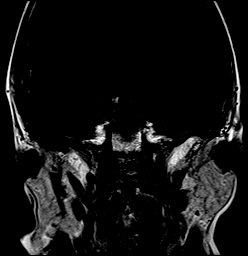

[Series 17: cor post dyn · coronal · 3.0mm · 0.62mm/px · 1 of 9 slices shown (1 of 6)]
[im 1/9]
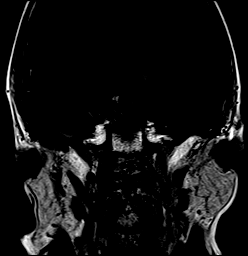

[Series 18: cor post dyn · coronal · 3.0mm · 0.62mm/px · 1 of 9 slices shown (2 of 6)]
[im 1/9]
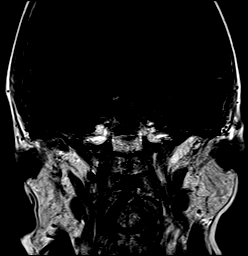

[Series 19: cor post dyn · coronal · 3.0mm · 0.62mm/px · 1 of 9 slices shown (3 of 6)]
[im 1/9]
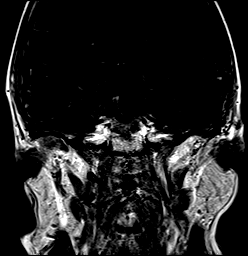

[Series 20: cor post dyn · coronal · 3.0mm · 0.62mm/px · 1 of 9 slices shown (4 of 6)]
[im 1/9]
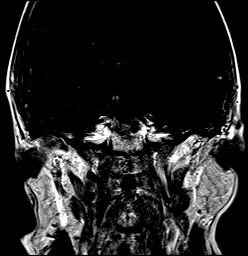

[Series 21: cor post dyn · coronal · 3.0mm · 0.62mm/px · 1 of 9 slices shown (5 of 6)]
[im 1/9]
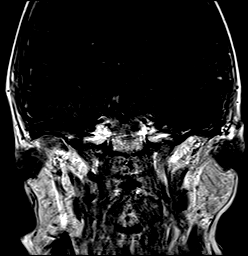

[Series 22: cor post dyn · coronal · 3.0mm · 0.62mm/px · 1 of 9 slices shown (6 of 6)]
[im 1/9]
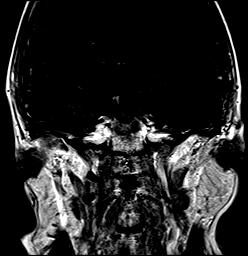

[Series 23: T1 post-contrast · sagittal · 3.0mm · 0.42mm/px · 1 of 13 slices shown (1 of 3)]
[im 1/13]
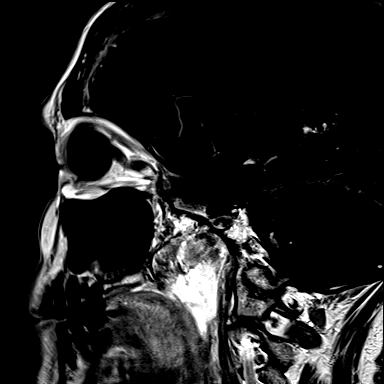

[Series 24: T1 post-contrast · coronal · 3.0mm · 0.42mm/px · 1 of 10 slices shown (2 of 3)]
[im 1/10]
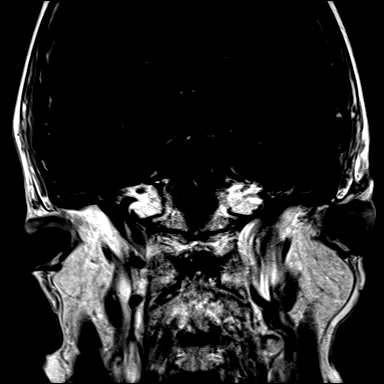

[Series 25: T2 post-contrast · axial · 1.0mm · 0.86mm/px · z∈[-65,+106]mm · 10 of 176 slices shown]
[im 1/176]
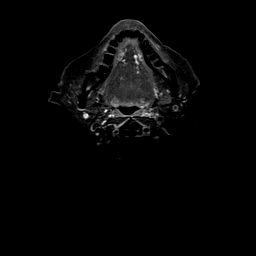
[im 20/176]
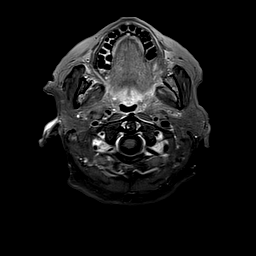
[im 39/176]
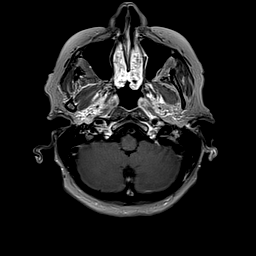
[im 59/176]
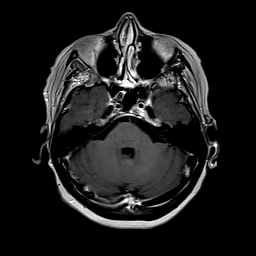
[im 78/176]
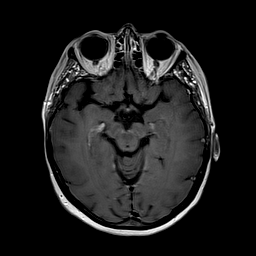
[im 98/176]
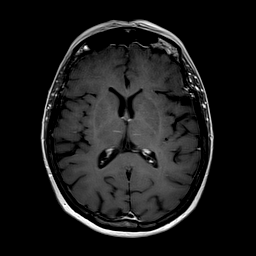
[im 117/176]
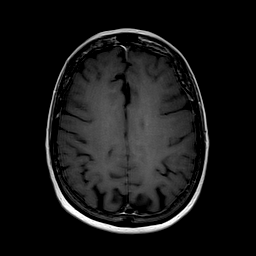
[im 137/176]
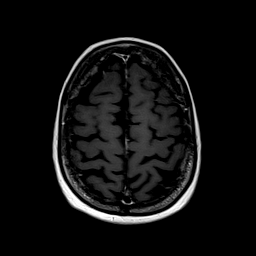
[im 156/176]
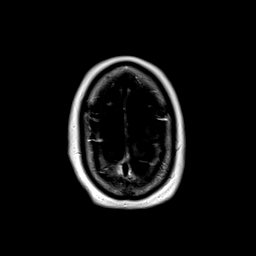
[im 176/176]
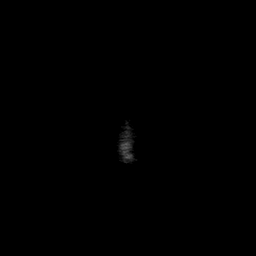

[Series 26: T1 post-contrast · coronal · 3.0mm · 0.42mm/px · 1 of 10 slices shown (3 of 3)]
[im 1/10]
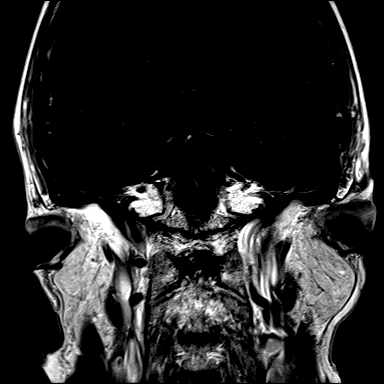

[48 of 48 positions shown; findings below may reference images not displayed]

FINDINGS: Brain: No acute infarction, hemorrhage, hydrocephalus, extra-axial
collection or mass lesion. T2 hyperintense signal in the
periventricular white matter, likely the sequela of chronic small
vessel ischemic disease.

Unchanged appearance of the sella, with a small amount of residual
pituitary tumor in the left aspect, extending towards the left
cavernous sinus, measuring approximately 6 x 8 x 5 mm (AP x TR x CC)
(series 23, image 9 and series 304, image 7), previously 6 x 9 x 6
mm when remeasured similarly. The infundibulum is slightly right of
midline. No mass effect. The optic chiasm is unremarkable.

Vascular: Normal flow voids.

Skull and upper cervical spine: Normal marrow signal.

Sinuses/Orbits: Negative.

Other: The mastoids are well aerated.
IMPRESSION: Continued involution of a treated pituitary adenoma, minimally
decreased in size from prior exam

## 2021-11-12 DIAGNOSIS — H179 Unspecified corneal scar and opacity: Secondary | ICD-10-CM | POA: Diagnosis not present

## 2021-11-12 DIAGNOSIS — H43812 Vitreous degeneration, left eye: Secondary | ICD-10-CM | POA: Diagnosis not present

## 2021-12-16 DIAGNOSIS — D485 Neoplasm of uncertain behavior of skin: Secondary | ICD-10-CM | POA: Diagnosis not present

## 2021-12-16 DIAGNOSIS — H61001 Unspecified perichondritis of right external ear: Secondary | ICD-10-CM | POA: Diagnosis not present

## 2021-12-16 DIAGNOSIS — D1724 Benign lipomatous neoplasm of skin and subcutaneous tissue of left leg: Secondary | ICD-10-CM | POA: Diagnosis not present

## 2021-12-16 DIAGNOSIS — D2262 Melanocytic nevi of left upper limb, including shoulder: Secondary | ICD-10-CM | POA: Diagnosis not present

## 2021-12-16 DIAGNOSIS — D225 Melanocytic nevi of trunk: Secondary | ICD-10-CM | POA: Diagnosis not present

## 2021-12-16 DIAGNOSIS — L821 Other seborrheic keratosis: Secondary | ICD-10-CM | POA: Diagnosis not present

## 2021-12-16 DIAGNOSIS — D2261 Melanocytic nevi of right upper limb, including shoulder: Secondary | ICD-10-CM | POA: Diagnosis not present

## 2021-12-16 DIAGNOSIS — Z85828 Personal history of other malignant neoplasm of skin: Secondary | ICD-10-CM | POA: Diagnosis not present

## 2021-12-19 DIAGNOSIS — E039 Hypothyroidism, unspecified: Secondary | ICD-10-CM | POA: Diagnosis not present

## 2021-12-19 DIAGNOSIS — D497 Neoplasm of unspecified behavior of endocrine glands and other parts of nervous system: Secondary | ICD-10-CM | POA: Diagnosis not present

## 2021-12-19 DIAGNOSIS — E230 Hypopituitarism: Secondary | ICD-10-CM | POA: Diagnosis not present

## 2021-12-19 DIAGNOSIS — E2749 Other adrenocortical insufficiency: Secondary | ICD-10-CM | POA: Diagnosis not present

## 2022-01-29 DIAGNOSIS — E7800 Pure hypercholesterolemia, unspecified: Secondary | ICD-10-CM | POA: Diagnosis not present

## 2022-01-29 DIAGNOSIS — D72819 Decreased white blood cell count, unspecified: Secondary | ICD-10-CM | POA: Diagnosis not present

## 2022-02-05 DIAGNOSIS — E7800 Pure hypercholesterolemia, unspecified: Secondary | ICD-10-CM | POA: Diagnosis not present

## 2022-02-05 DIAGNOSIS — M810 Age-related osteoporosis without current pathological fracture: Secondary | ICD-10-CM | POA: Diagnosis not present

## 2022-02-05 DIAGNOSIS — N1831 Chronic kidney disease, stage 3a: Secondary | ICD-10-CM | POA: Diagnosis not present

## 2022-03-05 DIAGNOSIS — H179 Unspecified corneal scar and opacity: Secondary | ICD-10-CM | POA: Diagnosis not present

## 2022-03-05 DIAGNOSIS — B0052 Herpesviral keratitis: Secondary | ICD-10-CM | POA: Diagnosis not present

## 2022-03-05 DIAGNOSIS — H2513 Age-related nuclear cataract, bilateral: Secondary | ICD-10-CM | POA: Diagnosis not present

## 2022-05-13 ENCOUNTER — Other Ambulatory Visit: Payer: Self-pay

## 2022-05-13 ENCOUNTER — Ambulatory Visit
Admission: EM | Admit: 2022-05-13 | Discharge: 2022-05-13 | Disposition: A | Discharge status: Home / Self Care | Payer: PPO | Attending: Emergency Medicine | Admitting: Emergency Medicine

## 2022-05-13 ENCOUNTER — Encounter: Payer: Self-pay | Admitting: Emergency Medicine

## 2022-05-13 DIAGNOSIS — B029 Zoster without complications: Secondary | ICD-10-CM

## 2022-05-13 MED ORDER — VALACYCLOVIR HCL 1 G PO TABS: 1000.0000 mg | ORAL_TABLET | Freq: Three times a day (TID) | ORAL | 0 refills | Status: DC

## 2022-05-13 NOTE — Discharge Instructions (Signed)
Today you are being treated for shingles  Take valacyclovir 3 times daily (every 8 hours) for 7 days, this medicine ideally reveals slow down the viral process which will decrease the severity and timeline of your illness   Helping with itching and discomfort  Put cold, wet cloths (cold compresses) on the area of the rash or blisters as told by your doctor. Cool baths can help you feel better. Try adding baking soda or dry oatmeal to the water to lessen itching. Do not bathe in hot water. Use calamine lotion as told by your doctor. Blister and rash care Keep your rash covered with a loose bandage (dressing). Wear loose clothing that does not rub on your rash. Wash your hands with soap and water for at least 20 seconds before and after you change your bandage. If you cannot use soap and water, use hand sanitizer. Change your bandage as told by your doctor. Keep your rash and blisters clean. To do this, wash the area with mild soap and cool water as told by your doctor. Check your rash every day for signs of infection. Check for: More redness, swelling, or pain. Fluid or blood. Warmth. Pus or a bad smell. Do not scratch your rash. Do not pick at your blisters. To help you to not scratch: Keep your fingernails clean and cut short. Wear gloves or mittens when you sleep, if scratching is a problem.

## 2022-05-13 NOTE — ED Provider Notes (Signed)
Roderic Palau    CSN: [id number] Arrival date & time: 05/13/22  1117      History   Chief Complaint Chief Complaint  Patient presents with   Rash    HPI Brianna Hammond is a 76 y.o. female.   Patient presents with a rash to the right lower back beginning 2 days ago. Rash is causing discomfort.  Unable to visualize, has attempted to take picture but unsuccessful.  Endorses that she took a shower and immediately started to have a burning sensation over the affected area.  Has not attempted treatment of symptoms.  Denies pruritus, drainage, fever or chills.  Concerned for shingles as she has had in the past with ocular involvement.  Past Medical History:  Diagnosis Date   Acquired hypothyroidism 04/04/2016   Age-related osteoporosis without current pathological fracture 09/29/2016   Arthritis    Benign neoplasm of pituitary gland and craniopharyngeal duct ([place]) 11/15/2012   Calculus of kidney 11/15/2012   Chronic renal failure    Fibrocystic breast disease    GERD (gastroesophageal reflux disease)    Hepatitis A    age 47   History of kidney stones    History of pituitary tumor 08/14/2017   Overview:  s/p removal   History of radiation therapy 1/25, 1/27, 1/29, 2/2, 11/15/14   SRS pituitary adenoma 5 Gy in 5 fx, total 25 Gy   Hypothyroid    Infection of urinary tract 11/15/2012   Kidney calculus    Low serum cortisol level 09/10/2016   Microscopic hematuria 11/15/2012   Morgagni hernia    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   Osteoporosis    Pituitary adenoma ([place]) 2005   Pituitary adenoma ([place]) 10/30/2014   Pure hypercholesterolemia 04/04/2016   Small kidney, unilateral 12/20/2014   TMJ syndrome    Trigeminal herpes zoster    Vaccine counseling 09/10/2016    Patient Active Problem List   Diagnosis Date Noted   Hypothermia 03/08/2021   Non-recurrent inguinal hernia without obstruction or gangrene    CKD (chronic kidney disease) stage 3, GFR 30-59  ml/min ([place]) 10/26/2017   History of pituitary tumor 08/14/2017   Age-related osteoporosis without current pathological fracture 09/29/2016   Low serum cortisol level 09/10/2016   Encounter for general adult medical examination without abnormal findings 09/10/2016   Morgagni hernia    Pure hypercholesterolemia 04/04/2016   Acquired hypothyroidism 04/04/2016   Small kidney, unilateral 12/20/2014   Pituitary adenoma ([place]) 10/30/2014   Infection of urinary tract 11/15/2012   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   Microscopic hematuria 11/15/2012   Calculus of kidney 11/15/2012   Benign neoplasm of pituitary gland and craniopharyngeal duct ([place]) 11/15/2012    Past Surgical History:  Procedure Laterality Date   BRAIN SURGERY     BREAST BIOPSY Left 1986   neg   COLONOSCOPY     HERNIA REPAIR     morgagnia   HIATAL HERNIA REPAIR N/A 05/01/2016   Procedure: LAPAROSCOPIC REPAIR OF MORGAGNI HERNIA;  Surgeon: Jules Husbands, MD;  Location: [facility] ORS;  Service: General;  Laterality: N/A;   INGUINAL HERNIA REPAIR Bilateral 10/28/2017   Procedure: LAPAROSCOPIC BILATERAL INGUINAL HERNIA REPAIR;  Surgeon: Jules Husbands, MD;  Location: [facility] ORS;  Service: General;  Laterality: Bilateral;   NEPHRECTOMY RADICAL Left 08/2003   for metanephric adenoma   TONSILLECTOMY     age 40   TRANSPHENOIDAL PITUITARY RESECTION  03/14/2004   infarcted pituitary tumor  with features most suggestive of pituitary adenoma    OB History   No obstetric history on file.      Home Medications    Prior to Admission medications   Medication Sig Start Date End Date Taking? Authorizing Provider  valACYclovir (VALTREX) 1000 MG tablet Take 1 tablet (1,000 mg total) by mouth 3 (three) times daily. 05/13/22  Yes Kataleya Zaugg, Leitha Schuller, NP  calcium carbonate (TUMS - DOSED IN MG ELEMENTAL CALCIUM) 500 MG chewable tablet Chew 1 tablet by mouth daily as needed for indigestion or heartburn.    [provider]  Calcium Citrate-Vitamin D (CALCIUM CITRATE + D3 PO) Take 1 tablet by mouth daily.    [provider]  levothyroxine (SYNTHROID, LEVOTHROID) 50 MCG tablet Take 50 mcg by mouth daily.  09/18/14   [provider]  ondansetron (ZOFRAN ODT) 4 MG disintegrating tablet Take 1 tablet (4 mg total) by mouth every 8 (eight) hours as needed for nausea or vomiting. 03/05/21   Blake Divine, MD  pravastatin (PRAVACHOL) 10 MG tablet Take 10 mg by mouth daily.    [provider]  vitamin B-12 (CYANOCOBALAMIN) 1000 MCG tablet Take 1,000 mcg by mouth daily.    [provider]    Family History Family History  Problem Relation Age of Onset   Cancer Mother        liver   Lymphoma Mother    Breast cancer Neg Hx     Social History Social History   Tobacco Use   Smoking status: Never   Smokeless tobacco: Never  Vaping Use   Vaping Use: Never used  Substance Use Topics   Alcohol use: Yes    Comment: socially, rarely   Drug use: No     Allergies   Dexamethasone, Dilaudid [hydromorphone hcl], Dexamethasone sodium phosphate, Ezetimibe, and Latex   Review of Systems Review of Systems  Constitutional: Negative.   HENT: Negative.    Respiratory: Negative.    Skin:  Positive for rash. Negative for color change, pallor and wound.     Physical Exam Triage Vital Signs ED Triage Vitals [05/13/22 1154]  Enc Vitals Group     BP 134/86     Pulse Rate 65     Resp 16     Temp 98.2 F (36.8 C)     Temp Source Oral     SpO2 99 %     Weight      Height      Head Circumference      Peak Flow      Pain Score      Pain Loc      Pain Edu?      Excl. in [place]?    No data found.  Updated Vital Signs BP 134/86 (BP Location: Left Arm)   Pulse 65   Temp 98.2 F (36.8 C) (Oral)   Resp 16   SpO2 99%   Visual Acuity Right Eye Distance:   Left Eye Distance:   Bilateral Distance:    Right Eye Near:   Left Eye Near:    Bilateral Near:     Physical  Exam Constitutional:      Appearance: Normal appearance.  Eyes:     Extraocular Movements: Extraocular movements intact.  Skin:    Comments: No abnormalities are noted to the skin over the area of concern   Neurological:     Mental Status: She is alert and oriented to person, place, and time. Mental status is  at baseline.  Psychiatric:        Mood and Affect: Mood normal.        Behavior: Behavior normal.      UC Treatments / Results  Labs (all labs ordered are listed, but only abnormal results are displayed) Labs Reviewed - No data to display  EKG   Radiology No results found.  Procedures Procedures (including critical care time)  Medications Ordered in UC Medications - No data to display  Initial Impression / Assessment and Plan / UC Course  I have reviewed the triage vital signs and the nursing notes.  Pertinent labs & imaging results that were available during my care of the patient were reviewed by me and considered in my medical decision making (see chart for details).  Herpes zoster without complication  Presentation of unilateral burning to the skin is consistent with shingles, expecting rash to present within the next 1 to 2 days, discussed with patient, due to history we will prophylactically treat, valacyclovir prescribed, discussed administration, discussed and given written handout for blister care if rash recurs, may follow-up with urgent care or PCP for reevaluation as needed  Final Clinical Impressions(s) / UC Diagnoses   Final diagnoses:  Herpes zoster without complication     Discharge Instructions      Today you are being treated for shingles  Take valacyclovir 3 times daily (every 8 hours) for 7 days, this medicine ideally reveals slow down the viral process which will decrease the severity and timeline of your illness   Helping with itching and discomfort  Put cold, wet cloths (cold compresses) on the area of the rash or blisters as told  by your doctor. Cool baths can help you feel better. Try adding baking soda or dry oatmeal to the water to lessen itching. Do not bathe in hot water. Use calamine lotion as told by your doctor. Blister and rash care Keep your rash covered with a loose bandage (dressing). Wear loose clothing that does not rub on your rash. Wash your hands with soap and water for at least 20 seconds before and after you change your bandage. If you cannot use soap and water, use hand sanitizer. Change your bandage as told by your doctor. Keep your rash and blisters clean. To do this, wash the area with mild soap and cool water as told by your doctor. Check your rash every day for signs of infection. Check for: More redness, swelling, or pain. Fluid or blood. Warmth. Pus or a bad smell. Do not scratch your rash. Do not pick at your blisters. To help you to not scratch: Keep your fingernails clean and cut short. Wear gloves or mittens when you sleep, if scratching is a problem.   ED Prescriptions     Medication Sig Dispense Auth. Provider   valACYclovir (VALTREX) 1000 MG tablet Take 1 tablet (1,000 mg total) by mouth 3 (three) times daily. 21 tablet Sameerah Nachtigal, Leitha Schuller, NP      PDMP not reviewed this encounter.   Hans Eden, NP 05/13/22 1305

## 2022-05-13 NOTE — ED Triage Notes (Signed)
Complains of a rash to right mid/lower back.  Noticed rash yesterday.  Patient is concerned for shingles.   Area burns.

## 2022-06-09 ENCOUNTER — Other Ambulatory Visit: Payer: Self-pay | Admitting: Family Medicine

## 2022-06-09 DIAGNOSIS — Z1231 Encounter for screening mammogram for malignant neoplasm of breast: Secondary | ICD-10-CM

## 2022-07-02 ENCOUNTER — Other Ambulatory Visit: Payer: PPO

## 2022-07-03 ENCOUNTER — Other Ambulatory Visit: Payer: Self-pay

## 2022-07-03 DIAGNOSIS — Z905 Acquired absence of kidney: Secondary | ICD-10-CM

## 2022-07-03 DIAGNOSIS — R319 Hematuria, unspecified: Secondary | ICD-10-CM

## 2022-07-03 DIAGNOSIS — N200 Calculus of kidney: Secondary | ICD-10-CM

## 2022-07-10 ENCOUNTER — Ambulatory Visit: Payer: PPO

## 2022-07-17 ENCOUNTER — Ambulatory Visit: Payer: PPO | Admitting: Urology

## 2022-07-23 ENCOUNTER — Ambulatory Visit
Admission: RE | Admit: 2022-07-23 | Discharge: 2022-07-23 | Disposition: A | Discharge status: Home / Self Care | Payer: PPO | Source: Ambulatory Visit | Attending: Urology | Admitting: Urology

## 2022-07-23 DIAGNOSIS — R319 Hematuria, unspecified: Secondary | ICD-10-CM | POA: Insufficient documentation

## 2022-07-23 DIAGNOSIS — Z905 Acquired absence of kidney: Secondary | ICD-10-CM | POA: Insufficient documentation

## 2022-07-23 DIAGNOSIS — N200 Calculus of kidney: Secondary | ICD-10-CM | POA: Insufficient documentation

## 2022-07-24 ENCOUNTER — Ambulatory Visit: Payer: PPO | Admitting: Urology

## 2022-07-29 ENCOUNTER — Ambulatory Visit
Admission: RE | Admit: 2022-07-29 | Discharge: 2022-07-29 | Disposition: A | Discharge status: Home / Self Care | Payer: PPO | Source: Ambulatory Visit | Attending: Family Medicine | Admitting: Family Medicine

## 2022-07-29 DIAGNOSIS — Z1231 Encounter for screening mammogram for malignant neoplasm of breast: Secondary | ICD-10-CM | POA: Diagnosis not present

## 2022-08-05 DIAGNOSIS — N1831 Chronic kidney disease, stage 3a: Secondary | ICD-10-CM | POA: Diagnosis not present

## 2022-08-05 DIAGNOSIS — E7800 Pure hypercholesterolemia, unspecified: Secondary | ICD-10-CM | POA: Diagnosis not present

## 2022-08-12 DIAGNOSIS — E871 Hypo-osmolality and hyponatremia: Secondary | ICD-10-CM | POA: Diagnosis not present

## 2022-08-12 DIAGNOSIS — E7800 Pure hypercholesterolemia, unspecified: Secondary | ICD-10-CM | POA: Diagnosis not present

## 2022-08-12 DIAGNOSIS — N1831 Chronic kidney disease, stage 3a: Secondary | ICD-10-CM | POA: Diagnosis not present

## 2022-08-12 DIAGNOSIS — Z0000 Encounter for general adult medical examination without abnormal findings: Secondary | ICD-10-CM | POA: Diagnosis not present

## 2022-08-25 DIAGNOSIS — E230 Hypopituitarism: Secondary | ICD-10-CM | POA: Diagnosis not present

## 2022-08-25 DIAGNOSIS — E039 Hypothyroidism, unspecified: Secondary | ICD-10-CM | POA: Diagnosis not present

## 2022-08-25 DIAGNOSIS — D497 Neoplasm of unspecified behavior of endocrine glands and other parts of nervous system: Secondary | ICD-10-CM | POA: Diagnosis not present

## 2022-08-25 DIAGNOSIS — E2749 Other adrenocortical insufficiency: Secondary | ICD-10-CM | POA: Diagnosis not present

## 2022-08-28 ENCOUNTER — Encounter: Payer: Self-pay | Admitting: Urology

## 2022-08-28 ENCOUNTER — Ambulatory Visit: Payer: PPO | Admitting: Urology

## 2022-08-28 VITALS — BP 112/74 | HR 65 | Ht 61.0 in | Wt 114.0 lb

## 2022-08-28 DIAGNOSIS — N200 Calculus of kidney: Secondary | ICD-10-CM

## 2022-08-28 DIAGNOSIS — Z87442 Personal history of urinary calculi: Secondary | ICD-10-CM

## 2022-08-28 DIAGNOSIS — Z905 Acquired absence of kidney: Secondary | ICD-10-CM

## 2022-08-28 NOTE — Progress Notes (Signed)
   08/28/2022 11:27 AM   Brianna Hammond [birth year] [id number]  Reason for visit: Follow up solitary kidney, nephrolithiasis, history of microscopic hematuria  HPI: She is a 76 year old female with a history of a left radical nephrectomy for an AML in 2004, as well as a history of right-sided nephrolithiasis in 2005.  She has had no stones since that time.  She also has a 20+ year history of low-grade microscopic hematuria, and has deferred further evaluation with cystoscopy and CT urogram multiple times, and we have discussed the risks and benefits as well as low, but nonzero, risk of missing additional pathology.  She denies any problems over the last year, specifically no episodes of kidney stones or UTIs.  Her husband underwent a HOLEP previously with me and continues to do very well.   I personally reviewed her renal ultrasound dated 07/24/2022 which shows a solitary right kidney with no evidence of hydronephrosis or nephrolithiasis.     We discussed prevention strategies for UTI including cranberry tablet prophylaxis and behavioral strategies.  Return precautions discussed regarding any right-sided flank pain or gross hematuria in the setting of her solitary kidney.  She would like to continue yearly renal ultrasounds in the setting of her history of stones and a solitary kidney.  RTC 1 year with renal ultrasound prior   Billey Co, MD  [facility] [address] [place], [place] [address] [phone]

## 2022-09-01 DIAGNOSIS — B0052 Herpesviral keratitis: Secondary | ICD-10-CM | POA: Diagnosis not present

## 2022-10-08 ENCOUNTER — Encounter: Payer: Self-pay | Admitting: Urology

## 2022-10-24 ENCOUNTER — Ambulatory Visit: Payer: PPO | Admitting: Physician Assistant

## 2022-10-24 VITALS — BP 109/73 | HR 98 | Ht 61.0 in

## 2022-10-24 DIAGNOSIS — R300 Dysuria: Secondary | ICD-10-CM

## 2022-10-24 LAB — MICROSCOPIC EXAMINATION: RBC, Urine: >30 /hpf — AB (ref 0–2)

## 2022-10-24 LAB — URINALYSIS, COMPLETE
Bilirubin, UA: NEGATIVE
Glucose, UA: NEGATIVE
Nitrite, UA: NEGATIVE
Protein,UA: NEGATIVE
Specific Gravity, UA: 1.025 (ref 1.005–1.030)
Urobilinogen, Ur: 0.2 mg/dL (ref 0.2–1.0)
pH, UA: 5 (ref 5.0–7.5)

## 2022-10-24 MED ORDER — SULFAMETHOXAZOLE-TRIMETHOPRIM 800-160 MG PO TABS
1.0000 | ORAL_TABLET | Freq: Two times a day (BID) | ORAL | 0 refills | Status: AC
Start: 2022-10-24 — End: 2022-10-29

## 2022-10-24 NOTE — Progress Notes (Signed)
10/24/2022 4:37 PM   Brianna Hammond 10-Apr-1946 [id number]  CC: Chief Complaint  Patient presents with   Dysuria   HPI: Brianna Hammond is a 77 y.o. female with PMH solitary right kidney, nephrolithiasis, and microscopic hematuria who presents today for evaluation of possible UTI.   Today she reports an approximate 3 to 4-day history of cloudy urine and dysuria.  She denies fever, chills, nausea, vomiting, flank pain, and gross hematuria.  Renal ultrasound performed on 07/23/2022 with no evidence of right renal stones.  In-office UA today positive for trace ketones, 3+ blood, and trace leukocytes; urine microscopy with 11-30 WBCs/HPF, >30 RBCs/HPF, and moderate bacteria.   PMH: Past Medical History:  Diagnosis Date   Acquired hypothyroidism 04/04/2016   Age-related osteoporosis without current pathological fracture 09/29/2016   Arthritis    Benign neoplasm of pituitary gland and craniopharyngeal duct ([place]) 11/15/2012   Calculus of kidney 11/15/2012   Chronic renal failure    Fibrocystic breast disease    GERD (gastroesophageal reflux disease)    Hepatitis A    age 89   History of kidney stones    History of pituitary tumor 08/14/2017   Overview:  s/p removal   History of radiation therapy 1/25, 1/27, 1/29, 2/2, 11/15/14   SRS pituitary adenoma 5 Gy in 5 fx, total 25 Gy   Hypothyroid    Infection of urinary tract 11/15/2012   Kidney calculus    Low serum cortisol level 09/10/2016   Microscopic hematuria 11/15/2012   Morgagni hernia    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   Osteoporosis    Pituitary adenoma ([place]) 2005   Pituitary adenoma ([place]) 10/30/2014   Pure hypercholesterolemia 04/04/2016   Small kidney, unilateral 12/20/2014   TMJ syndrome    Trigeminal herpes zoster    Vaccine counseling 09/10/2016    Surgical History: Past Surgical History:  Procedure Laterality Date   BRAIN SURGERY     BREAST BIOPSY Left 1986   neg   COLONOSCOPY     HERNIA  REPAIR     morgagnia   HIATAL HERNIA REPAIR N/A 05/01/2016   Procedure: LAPAROSCOPIC REPAIR OF MORGAGNI HERNIA;  Surgeon: Jules Husbands, MD;  Location: [facility] ORS;  Service: General;  Laterality: N/A;   INGUINAL HERNIA REPAIR Bilateral 10/28/2017   Procedure: LAPAROSCOPIC BILATERAL INGUINAL HERNIA REPAIR;  Surgeon: Jules Husbands, MD;  Location: [facility] ORS;  Service: General;  Laterality: Bilateral;   NEPHRECTOMY RADICAL Left 08/2003   for metanephric adenoma   TONSILLECTOMY     age 36   TRANSPHENOIDAL PITUITARY RESECTION  03/14/2004   infarcted pituitary tumor with features most suggestive of pituitary adenoma    Home Medications:  Allergies as of 10/24/2022       Reactions   Dexamethasone Other (See Comments), Hives, Rash   Flushing, hives   Dilaudid [hydromorphone Hcl] Anaphylaxis   Apnea/ required narcan in pacu to reverse   Dexamethasone Sodium Phosphate Hives   Ezetimibe Other (See Comments)   Muscle pain    Latex Rash   And blisters Other reaction(s): Other (See Comments) And blisters        Medication List        Accurate as of October 24, 2022  4:37 PM. If you have any questions, ask your nurse or doctor.          aspirin EC 81 MG tablet Take 81 mg by mouth. Every other day   calcium carbonate 500 MG chewable  tablet Commonly known as: TUMS - dosed in mg elemental calcium Chew 1 tablet by mouth daily as needed for indigestion or heartburn.   CALCIUM CITRATE + D3 PO Take 1 tablet by mouth daily.   cyanocobalamin 1000 MCG tablet Commonly known as: VITAMIN B12 Take 1,000 mcg by mouth daily.   levothyroxine 50 MCG tablet Commonly known as: SYNTHROID Take 50 mcg by mouth daily.   ondansetron 4 MG disintegrating tablet Commonly known as: Zofran ODT Take 1 tablet (4 mg total) by mouth every 8 (eight) hours as needed for nausea or vomiting.   pravastatin 10 MG tablet Commonly known as: PRAVACHOL Take 10 mg by mouth daily.   sulfamethoxazole-trimethoprim  800-160 MG tablet Commonly known as: BACTRIM DS Take 1 tablet by mouth 2 (two) times daily for 5 days.        Allergies:  Allergies  Allergen Reactions   Dexamethasone Other (See Comments), Hives and Rash    Flushing, hives   Dilaudid [Hydromorphone Hcl] Anaphylaxis    Apnea/ required narcan in pacu to reverse   Dexamethasone Sodium Phosphate Hives   Ezetimibe Other (See Comments)    Muscle pain    Latex Rash    And blisters Other reaction(s): Other (See Comments) And blisters    Family History: Family History  Problem Relation Age of Onset   Cancer Mother        liver   Lymphoma Mother    Breast cancer Neg Hx     Social History:   reports that she has never smoked. She has never been exposed to tobacco smoke. She has never used smokeless tobacco. She reports current alcohol use. She reports that she does not use drugs.  Physical Exam: BP 109/73   Pulse 98   Ht '5\' 1"'$  (1.549 m)   BMI 21.54 kg/m   Constitutional:  Alert and oriented, no acute distress, nontoxic appearing HEENT: [place], AT Cardiovascular: No clubbing, cyanosis, or edema Respiratory: Normal respiratory effort, no increased work of breathing Skin: No rashes, bruises or suspicious lesions Neurologic: Grossly intact, no focal deficits, moving all 4 extremities Psychiatric: Normal mood and affect  Laboratory Data: Results for orders placed or performed in visit on 10/24/22  Microscopic Examination   Urine  Result Value Ref Range   WBC, UA 11-30 (A) 0 - 5 /hpf   RBC, Urine >30 (A) 0 - 2 /hpf   Epithelial Cells (non renal) 0-10 0 - 10 /hpf   Casts Present (A) None seen /lpf   Cast Type Hyaline casts N/A   Bacteria, UA Moderate (A) None seen/Few  Urinalysis, Complete  Result Value Ref Range   Specific Gravity, UA 1.025 1.005 - 1.030   pH, UA 5.0 5.0 - 7.5   Color, UA Yellow Yellow   Appearance Ur Clear Clear   Leukocytes,UA Trace (A) Negative   Protein,UA Negative Negative/Trace   Glucose, UA  Negative Negative   Ketones, UA Trace (A) Negative   RBC, UA 3+ (A) Negative   Bilirubin, UA Negative Negative   Urobilinogen, Ur 0.2 0.2 - 1.0 mg/dL   Nitrite, UA Negative Negative   Microscopic Examination See below:    Assessment & Plan:   1. Dysuria UA appears grossly infected today, will start empiric Bactrim and send for culture for further evaluation.  No evidence of nephrolithiasis on recent renal ultrasound.  She understands to keep a low threshold for pursuing follow-up care if her symptoms do not improve or if they worsen given her solitary  kidney. - Urinalysis, Complete - CULTURE, URINE COMPREHENSIVE - sulfamethoxazole-trimethoprim (BACTRIM DS) 800-160 MG tablet; Take 1 tablet by mouth 2 (two) times daily for 5 days.  Dispense: 10 tablet; Refill: 0  Return if symptoms worsen or fail to improve.  Debroah Loop, PA-C  [facility] [address], [place], [place] [address] [phone]

## 2022-10-25 ENCOUNTER — Other Ambulatory Visit: Payer: Self-pay

## 2022-10-25 ENCOUNTER — Observation Stay: Payer: PPO

## 2022-10-25 ENCOUNTER — Emergency Department: Payer: PPO

## 2022-10-25 ENCOUNTER — Encounter: Payer: Self-pay | Admitting: Internal Medicine

## 2022-10-25 ENCOUNTER — Observation Stay
Admission: EM | Admit: 2022-10-25 | Discharge: 2022-10-26 | Disposition: A | Discharge status: Home / Self Care | Payer: PPO | Attending: Internal Medicine | Admitting: Internal Medicine

## 2022-10-25 DIAGNOSIS — Z79890 Hormone replacement therapy (postmenopausal): Secondary | ICD-10-CM | POA: Diagnosis not present

## 2022-10-25 DIAGNOSIS — D352 Benign neoplasm of pituitary gland: Secondary | ICD-10-CM | POA: Diagnosis not present

## 2022-10-25 DIAGNOSIS — Z7982 Long term (current) use of aspirin: Secondary | ICD-10-CM | POA: Diagnosis not present

## 2022-10-25 DIAGNOSIS — S065XAA Traumatic subdural hemorrhage with loss of consciousness status unknown, initial encounter: Secondary | ICD-10-CM

## 2022-10-25 DIAGNOSIS — E785 Hyperlipidemia, unspecified: Secondary | ICD-10-CM | POA: Diagnosis present

## 2022-10-25 DIAGNOSIS — E039 Hypothyroidism, unspecified: Secondary | ICD-10-CM | POA: Insufficient documentation

## 2022-10-25 DIAGNOSIS — N1831 Chronic kidney disease, stage 3a: Secondary | ICD-10-CM | POA: Insufficient documentation

## 2022-10-25 DIAGNOSIS — E876 Hypokalemia: Secondary | ICD-10-CM | POA: Insufficient documentation

## 2022-10-25 DIAGNOSIS — Z91040 Latex allergy status: Secondary | ICD-10-CM | POA: Insufficient documentation

## 2022-10-25 DIAGNOSIS — E871 Hypo-osmolality and hyponatremia: Secondary | ICD-10-CM | POA: Diagnosis not present

## 2022-10-25 DIAGNOSIS — Z79899 Other long term (current) drug therapy: Secondary | ICD-10-CM | POA: Insufficient documentation

## 2022-10-25 DIAGNOSIS — N390 Urinary tract infection, site not specified: Secondary | ICD-10-CM | POA: Insufficient documentation

## 2022-10-25 DIAGNOSIS — E875 Hyperkalemia: Secondary | ICD-10-CM | POA: Insufficient documentation

## 2022-10-25 DIAGNOSIS — I6782 Cerebral ischemia: Secondary | ICD-10-CM | POA: Diagnosis not present

## 2022-10-25 DIAGNOSIS — I6200 Nontraumatic subdural hemorrhage, unspecified: Secondary | ICD-10-CM | POA: Insufficient documentation

## 2022-10-25 DIAGNOSIS — G459 Transient cerebral ischemic attack, unspecified: Secondary | ICD-10-CM | POA: Diagnosis not present

## 2022-10-25 DIAGNOSIS — R4781 Slurred speech: Secondary | ICD-10-CM | POA: Diagnosis not present

## 2022-10-25 DIAGNOSIS — R479 Unspecified speech disturbances: Secondary | ICD-10-CM | POA: Diagnosis present

## 2022-10-25 DIAGNOSIS — I10 Essential (primary) hypertension: Secondary | ICD-10-CM | POA: Diagnosis not present

## 2022-10-25 LAB — OSMOLALITY, URINE: Osmolality, Ur: 465 mOsm/kg (ref 300–900)

## 2022-10-25 LAB — URINALYSIS, COMPLETE (UACMP) WITH MICROSCOPIC
Bilirubin Urine: NEGATIVE
Glucose, UA: NEGATIVE mg/dL
Ketones, ur: 5 mg/dL — AB
Leukocytes,Ua: NEGATIVE
Nitrite: NEGATIVE
Protein, ur: NEGATIVE mg/dL
Specific Gravity, Urine: 1.04 — ABNORMAL HIGH (ref 1.005–1.030)
Squamous Epithelial / HPF: NONE SEEN /HPF (ref 0–5)
pH: 5 (ref 5.0–8.0)

## 2022-10-25 LAB — DIFFERENTIAL
Abs Immature Granulocytes: 0.03 10*3/uL (ref 0.00–0.07)
Basophils Absolute: 0.1 10*3/uL (ref 0.0–0.1)
Basophils Relative: 1 %
Eosinophils Absolute: 0.2 10*3/uL (ref 0.0–0.5)
Eosinophils Relative: 3 %
Immature Granulocytes: 1 %
Lymphocytes Relative: 37 %
Lymphs Abs: 2.1 10*3/uL (ref 0.7–4.0)
Monocytes Absolute: 0.6 10*3/uL (ref 0.1–1.0)
Monocytes Relative: 10 %
Neutro Abs: 2.8 10*3/uL (ref 1.7–7.7)
Neutrophils Relative %: 48 %

## 2022-10-25 LAB — CBC
HCT: 35.9 % — ABNORMAL LOW (ref 36.0–46.0)
Hemoglobin: 11.5 g/dL — ABNORMAL LOW (ref 12.0–15.0)
MCH: 26 pg (ref 26.0–34.0)
MCHC: 32 g/dL (ref 30.0–36.0)
MCV: 81 fL (ref 80.0–100.0)
Platelets: 204 10*3/uL (ref 150–400)
RBC: 4.43 MIL/uL (ref 3.87–5.11)
RDW: 13.3 % (ref 11.5–15.5)
WBC: 5.8 10*3/uL (ref 4.0–10.5)
nRBC: 0 % (ref 0.0–0.2)

## 2022-10-25 LAB — BASIC METABOLIC PANEL
Anion gap: 9 (ref 5–15)
BUN: 21 mg/dL (ref 8–23)
CO2: 20 mmol/L — ABNORMAL LOW (ref 22–32)
Calcium: 8.8 mg/dL — ABNORMAL LOW (ref 8.9–10.3)
Chloride: 100 mmol/L (ref 98–111)
Creatinine, Ser: 1.31 mg/dL — ABNORMAL HIGH (ref 0.44–1.00)
GFR, Estimated: 42 mL/min — ABNORMAL LOW (ref >60)
Glucose, Bld: 87 mg/dL (ref 70–99)
Potassium: 3.5 mmol/L (ref 3.5–5.1)
Sodium: 129 mmol/L — ABNORMAL LOW (ref 135–145)

## 2022-10-25 LAB — APTT: aPTT: 47 seconds — ABNORMAL HIGH (ref 24–36)

## 2022-10-25 LAB — COMPREHENSIVE METABOLIC PANEL
ALT: 11 U/L (ref 0–44)
AST: 25 U/L (ref 15–41)
Albumin: 3.7 g/dL (ref 3.5–5.0)
Alkaline Phosphatase: 49 U/L (ref 38–126)
Anion gap: 9 (ref 5–15)
BUN: 21 mg/dL (ref 8–23)
CO2: 20 mmol/L — ABNORMAL LOW (ref 22–32)
Calcium: 8.7 mg/dL — ABNORMAL LOW (ref 8.9–10.3)
Chloride: 95 mmol/L — ABNORMAL LOW (ref 98–111)
Creatinine, Ser: 1.33 mg/dL — ABNORMAL HIGH (ref 0.44–1.00)
GFR, Estimated: 42 mL/min — ABNORMAL LOW (ref >60)
Glucose, Bld: 89 mg/dL (ref 70–99)
Potassium: 3.4 mmol/L — ABNORMAL LOW (ref 3.5–5.1)
Sodium: 124 mmol/L — ABNORMAL LOW (ref 135–145)
Total Bilirubin: 0.8 mg/dL (ref 0.3–1.2)
Total Protein: 6.4 g/dL — ABNORMAL LOW (ref 6.5–8.1)

## 2022-10-25 LAB — ETHANOL: Alcohol, Ethyl (B): <10 mg/dL (ref <10)

## 2022-10-25 LAB — PROTIME-INR
INR: 1.1 (ref 0.8–1.2)
Prothrombin Time: 14.4 seconds (ref 11.4–15.2)

## 2022-10-25 LAB — CBG MONITORING, ED: Glucose-Capillary: 80 mg/dL (ref 70–99)

## 2022-10-25 LAB — SODIUM, URINE, RANDOM: Sodium, Ur: 47 mmol/L

## 2022-10-25 LAB — MAGNESIUM: Magnesium: 1.8 mg/dL (ref 1.7–2.4)

## 2022-10-25 LAB — PHOSPHORUS: Phosphorus: 3.3 mg/dL (ref 2.5–4.6)

## 2022-10-25 LAB — OSMOLALITY: Osmolality: 273 mOsm/kg — ABNORMAL LOW (ref 275–295)

## 2022-10-25 MED ORDER — SODIUM CHLORIDE 0.9 % IV SOLN: Freq: Once | INTRAVENOUS | Status: AC

## 2022-10-25 MED ORDER — OYSTER SHELL CALCIUM/D3 500-5 MG-MCG PO TABS
1.0000 | ORAL_TABLET | Freq: Every day | ORAL | Status: DC
  Administered 2022-10-26: 1 via ORAL
  Filled 2022-10-25: qty 1

## 2022-10-25 MED ORDER — HYDROCORTISONE 5 MG PO TABS
5.0000 mg | ORAL_TABLET | Freq: Every day | ORAL | Status: DC
  Administered 2022-10-26: 5 mg via ORAL
  Filled 2022-10-25: qty 1

## 2022-10-25 MED ORDER — HYDROCORTISONE 10 MG PO TABS
20.0000 mg | ORAL_TABLET | Freq: Once | ORAL | Status: AC
  Administered 2022-10-25: 20 mg via ORAL
  Filled 2022-10-25: qty 2

## 2022-10-25 MED ORDER — ACETAMINOPHEN 325 MG PO TABS: 650.0000 mg | ORAL_TABLET | ORAL | Status: DC | PRN

## 2022-10-25 MED ORDER — CYANOCOBALAMIN 500 MCG PO TABS
1000.0000 ug | ORAL_TABLET | Freq: Every day | ORAL | Status: DC
  Administered 2022-10-26: 1000 ug via ORAL
  Filled 2022-10-25: qty 2

## 2022-10-25 MED ORDER — HYDROCORTISONE 10 MG PO TABS: 20.0000 mg | ORAL_TABLET | Freq: Once | ORAL | Status: DC

## 2022-10-25 MED ORDER — SULFAMETHOXAZOLE-TRIMETHOPRIM 400-80 MG PO TABS
1.0000 | ORAL_TABLET | Freq: Two times a day (BID) | ORAL | Status: DC
  Administered 2022-10-25 – 2022-10-26 (×2): 1 via ORAL
  Filled 2022-10-25 (×2): qty 1

## 2022-10-25 MED ORDER — POTASSIUM CHLORIDE CRYS ER 20 MEQ PO TBCR
40.0000 meq | EXTENDED_RELEASE_TABLET | Freq: Once | ORAL | Status: AC
  Administered 2022-10-25: 40 meq via ORAL
  Filled 2022-10-25: qty 2

## 2022-10-25 MED ORDER — LEVOTHYROXINE SODIUM 50 MCG PO TABS
50.0000 ug | ORAL_TABLET | Freq: Every day | ORAL | Status: DC
  Administered 2022-10-26: 50 ug via ORAL
  Filled 2022-10-25: qty 1

## 2022-10-25 MED ORDER — PRAVASTATIN SODIUM 20 MG PO TABS
10.0000 mg | ORAL_TABLET | Freq: Every day | ORAL | Status: DC
  Administered 2022-10-26: 10 mg via ORAL
  Filled 2022-10-25: qty 1

## 2022-10-25 MED ORDER — SENNOSIDES-DOCUSATE SODIUM 8.6-50 MG PO TABS: 1.0000 | ORAL_TABLET | Freq: Every evening | ORAL | Status: DC | PRN

## 2022-10-25 MED ORDER — SODIUM CHLORIDE 1 G PO TABS
1.0000 g | ORAL_TABLET | Freq: Two times a day (BID) | ORAL | Status: DC
  Administered 2022-10-25 – 2022-10-26 (×2): 1 g via ORAL
  Filled 2022-10-25 (×3): qty 1

## 2022-10-25 MED ORDER — STROKE: EARLY STAGES OF RECOVERY BOOK: Freq: Once | Status: DC

## 2022-10-25 MED ORDER — SODIUM CHLORIDE 0.9 % IV SOLN: INTRAVENOUS | Status: DC

## 2022-10-25 MED ORDER — ACETAMINOPHEN 160 MG/5ML PO SOLN: 650.0000 mg | ORAL | Status: DC | PRN

## 2022-10-25 MED ORDER — SULFAMETHOXAZOLE-TRIMETHOPRIM 800-160 MG PO TABS: 1.0000 | ORAL_TABLET | Freq: Two times a day (BID) | ORAL | Status: DC

## 2022-10-25 MED ORDER — HYDRALAZINE HCL 20 MG/ML IJ SOLN: 5.0000 mg | INTRAMUSCULAR | Status: DC | PRN

## 2022-10-25 MED ORDER — IOHEXOL 350 MG/ML SOLN
75.0000 mL | Freq: Once | INTRAVENOUS | Status: AC | PRN
  Administered 2022-10-25: 75 mL via INTRAVENOUS

## 2022-10-25 MED ORDER — ACETAMINOPHEN 650 MG RE SUPP: 650.0000 mg | RECTAL | Status: DC | PRN

## 2022-10-25 MED ORDER — SODIUM CHLORIDE 0.9% FLUSH
3.0000 mL | Freq: Once | INTRAVENOUS | Status: AC
  Administered 2022-10-25: 3 mL via INTRAVENOUS

## 2022-10-25 MED ORDER — DIPHENHYDRAMINE HCL 50 MG/ML IJ SOLN: 12.5000 mg | Freq: Three times a day (TID) | INTRAMUSCULAR | Status: DC | PRN
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
## 2022-10-25 NOTE — Progress Notes (Signed)
PHARMACY NOTE:  ANTIMICROBIAL RENAL DOSAGE ADJUSTMENT  Current antimicrobial regimen includes a mismatch between antimicrobial dosage and estimated renal function.  As per policy approved by the Pharmacy & Therapeutics and Medical Executive Committees, the antimicrobial dosage will be adjusted accordingly.  Current antimicrobial dosage:  bactrim 1 DS q12  Indication: uti  Renal Function:  CrCl cannot be calculated (Unknown ideal weight.). '[]'$      On intermittent HD, scheduled: '[]'$      On CRRT    Antimicrobial dosage has been changed to:  bactrim 1 SS q12  Additional comments:   Thank you for allowing pharmacy to be a part of this patient's care.  Darrick Penna, [facility] 10/25/2022 6:42 PM

## 2022-10-25 NOTE — Progress Notes (Addendum)
Telestroke Note    1521: Code stroke cart activated at this time.Patient already in [place]. Dr. Rory Percy in [place] and already performed neuro exam before code stroke cart activation.  Per Dr. Rory Percy, patient began to experience some mild confusion, expressive aphasia, and word finding difficulties. LKW 1415. mRS 0. FANG D positive exam.   8590: Per Dr. Rory Percy, patient no longer a candidate for TNK. Dr. Rory Percy called CT imaging result by radiology at this time.     Berenice Bouton Telestroke RN

## 2022-10-25 NOTE — ED Notes (Signed)
Neurologist arrived at this time to triage room and is examining pt prior to CT. Pt still having hard time find words, including name. Neurologist is performing

## 2022-10-25 NOTE — ED Notes (Signed)
Patient transported to MRI 

## 2022-10-25 NOTE — ED Triage Notes (Signed)
Pt to ED with husband for sudden onset at 22 today of confusion and inability to find words.  Pt is oriented and speech is clear. Is having hard time finding some words during conversation.  Code stroke called. CBG   BP 115/77.
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
## 2022-10-25 NOTE — ED Notes (Signed)
Neurologist arrived to triage room, examined pt and said continue with code stroke. NIH performed in triage room.

## 2022-10-26 ENCOUNTER — Observation Stay
Admit: 2022-10-26 | Discharge: 2022-10-26 | Disposition: A | Discharge status: Home / Self Care | Payer: PPO | Attending: Internal Medicine | Admitting: Internal Medicine

## 2022-10-26 DIAGNOSIS — S065XAA Traumatic subdural hemorrhage with loss of consciousness status unknown, initial encounter: Secondary | ICD-10-CM | POA: Diagnosis not present

## 2022-10-26 DIAGNOSIS — G459 Transient cerebral ischemic attack, unspecified: Secondary | ICD-10-CM | POA: Diagnosis not present

## 2022-10-26 LAB — BASIC METABOLIC PANEL
Anion gap: 6 (ref 5–15)
Anion gap: 3 — ABNORMAL LOW (ref 5–15)
BUN: 19 mg/dL (ref 8–23)
BUN: 17 mg/dL (ref 8–23)
CO2: 21 mmol/L — ABNORMAL LOW (ref 22–32)
CO2: 21 mmol/L — ABNORMAL LOW (ref 22–32)
Calcium: 8.4 mg/dL — ABNORMAL LOW (ref 8.9–10.3)
Calcium: 8 mg/dL — ABNORMAL LOW (ref 8.9–10.3)
Chloride: 103 mmol/L (ref 98–111)
Chloride: 110 mmol/L (ref 98–111)
Creatinine, Ser: 1.36 mg/dL — ABNORMAL HIGH (ref 0.44–1.00)
Creatinine, Ser: 1.17 mg/dL — ABNORMAL HIGH (ref 0.44–1.00)
GFR, Estimated: 41 mL/min — ABNORMAL LOW (ref >60)
GFR, Estimated: 49 mL/min — ABNORMAL LOW (ref >60)
Glucose, Bld: 99 mg/dL (ref 70–99)
Glucose, Bld: 87 mg/dL (ref 70–99)
Potassium: 5.6 mmol/L — ABNORMAL HIGH (ref 3.5–5.1)
Potassium: 4.5 mmol/L (ref 3.5–5.1)
Sodium: 130 mmol/L — ABNORMAL LOW (ref 135–145)
Sodium: 134 mmol/L — ABNORMAL LOW (ref 135–145)

## 2022-10-26 LAB — HEMOGLOBIN A1C
Hgb A1c MFr Bld: 5.3 % (ref 4.8–5.6)
Mean Plasma Glucose: 105.41 mg/dL

## 2022-10-26 LAB — ECHOCARDIOGRAM COMPLETE
AR max vel: 1.59 cm2
AV Area VTI: 1.7 cm2
AV Area mean vel: 1.59 cm2
AV Mean grad: 4 mmHg
AV Peak grad: 6.8 mmHg
Ao pk vel: 1.3 m/s
Area-P 1/2: 5.31 cm2
Calc EF: 51.8 %
Height: 61 in
MV VTI: 1.6 cm2
S' Lateral: 2.6 cm
Single Plane A2C EF: 40.7 %
Single Plane A4C EF: 57 %
Weight: 1824 oz

## 2022-10-26 LAB — LIPID PANEL
Cholesterol: 155 mg/dL (ref 0–200)
HDL: 48 mg/dL (ref >40)
LDL Cholesterol: 99 mg/dL (ref 0–99)
Total CHOL/HDL Ratio: 3.2 RATIO
Triglycerides: 41 mg/dL (ref <150)
VLDL: 8 mg/dL (ref 0–40)

## 2022-10-26 MED ORDER — ONDANSETRON HCL 4 MG/2ML IJ SOLN
4.0000 mg | Freq: Four times a day (QID) | INTRAMUSCULAR | Status: DC | PRN
  Administered 2022-10-26: 4 mg via INTRAVENOUS
  Filled 2022-10-26: qty 2

## 2022-10-26 MED ORDER — PERFLUTREN LIPID MICROSPHERE
1.0000 mL | INTRAVENOUS | Status: AC | PRN
  Administered 2022-10-26: 3 mL via INTRAVENOUS

## 2022-10-26 MED ORDER — ATORVASTATIN CALCIUM 40 MG PO TABS: 40.0000 mg | ORAL_TABLET | Freq: Every day | ORAL | 0 refills | Status: DC

## 2022-10-26 NOTE — Progress Notes (Addendum)
Neurology Progress Note   S:// Seen and examined.  No acute changes.  Speech symptoms have completely resolved   O:// Current vital signs: BP 94/65   Pulse 79   Temp 97.9 F (36.6 C) (Oral)   Resp 18   Ht '5\' 1"'$  (1.549 m)   Wt 51.7 kg   SpO2 98%   BMI 21.54 kg/m  Vital signs in last 24 hours: Temp:  [97.7 F (36.5 C)-98.4 F (36.9 C)] 97.9 F (36.6 C) (01/14 0634) Pulse Rate:  [77-98] 79 (01/14 0700) Resp:  [14-21] 18 (01/14 0700) BP: (79-115)/(55-77) 94/65 (01/14 0700) SpO2:  [91 %-100 %] 98 % (01/14 0700) Weight:  [51.7 kg] 51.7 kg (01/13 1911) General: Awake alert in no distress HEENT: Normocephalic atraumatic Lungs: Clear Cardiovascular: Regular rhythm Abdomen nondistended Extremities well-perfused Neurological exam Awake alert oriented x 3 Not having difficulty with longer sentences today. Follows commands without difficulty Fluency is impaired, repetition is impaired, naming is inconsistent No dysarthria Cranial nerves II to XII intact Motor examination with no drift Sensation intact light touch without extinction Coordination with no dysmetria NIH-0  Medications  Current Facility-Administered Medications:     stroke: early stages of recovery book, , Does not apply, Once, Ivor Costa, MD   0.9 %  sodium chloride infusion, , Intravenous, Continuous, Ivor Costa, MD, Last Rate: 100 mL/hr at 10/25/22 2205, New Bag at 10/25/22 2205   acetaminophen (TYLENOL) tablet 650 mg, 650 mg, Oral, Q4H PRN **OR** acetaminophen (TYLENOL) 160 MG/5ML solution 650 mg, 650 mg, Per Tube, Q4H PRN **OR** acetaminophen (TYLENOL) suppository 650 mg, 650 mg, Rectal, Q4H PRN, Ivor Costa, MD   calcium-vitamin D (OSCAL WITH D) 500-5 MG-MCG per tablet 1 tablet, 1 tablet, Oral, Daily, Ivor Costa, MD   cyanocobalamin (VITAMIN B12) tablet 1,000 mcg, 1,000 mcg, Oral, Daily, Ivor Costa, MD   diphenhydrAMINE (BENADRYL) injection 12.5 mg, 12.5 mg, Intravenous, Q8H PRN, Ivor Costa, MD    hydrALAZINE (APRESOLINE) injection 5 mg, 5 mg, Intravenous, Q2H PRN, Ivor Costa, MD   hydrocortisone (CORTEF) tablet 5 mg, 5 mg, Oral, Daily, Ivor Costa, MD   levothyroxine (SYNTHROID) tablet 50 mcg, 50 mcg, Oral, Daily, Ivor Costa, MD, 50 mcg at 10/26/22 0558   pravastatin (PRAVACHOL) tablet 10 mg, 10 mg, Oral, Daily, Ivor Costa, MD   senna-docusate (Senokot-S) tablet 1 tablet, 1 tablet, Oral, QHS PRN, Ivor Costa, MD   sodium chloride tablet 1 g, 1 g, Oral, BID WC, Ivor Costa, MD, 1 g at 10/25/22 2009   sulfamethoxazole-trimethoprim (BACTRIM) 400-80 MG per tablet 1 tablet, 1 tablet, Oral, Q12H, Darrick Penna, RPH, 1 tablet at 10/25/22 2204  Current Outpatient Medications:    aspirin EC 81 MG tablet, Take 81 mg by mouth. Every other day, Disp: , Rfl:    calcium carbonate (TUMS - DOSED IN MG ELEMENTAL CALCIUM) 500 MG chewable tablet, Chew 1 tablet by mouth daily as needed for indigestion or heartburn., Disp: , Rfl:    Calcium Citrate-Vitamin D (CALCIUM CITRATE + D3 PO), Take 1 tablet by mouth daily., Disp: , Rfl:    hydrocortisone (CORTEF) 5 MG tablet, Take 5 mg by mouth daily., Disp: , Rfl:    levothyroxine (SYNTHROID, LEVOTHROID) 50 MCG tablet, Take 50 mcg by mouth daily. , Disp: , Rfl:    pravastatin (PRAVACHOL) 10 MG tablet, Take 10 mg by mouth daily., Disp: , Rfl:    sulfamethoxazole-trimethoprim (BACTRIM DS) 800-160 MG tablet, Take 1 tablet by mouth 2 (two) times daily for 5 days., Disp:  10 tablet, Rfl: 0   vitamin B-12 (CYANOCOBALAMIN) 1000 MCG tablet, Take 1,000 mcg by mouth daily., Disp: , Rfl:    ondansetron (ZOFRAN ODT) 4 MG disintegrating tablet, Take 1 tablet (4 mg total) by mouth every 8 (eight) hours as needed for nausea or vomiting. (Patient not taking: Reported on 10/25/2022), Disp: 12 tablet, Rfl: 0  Labs CBC    Component Value Date/Time   WBC 5.8 10/25/2022 1510   RBC 4.43 10/25/2022 1510   HGB 11.5 (L) 10/25/2022 1510   HCT 35.9 (L) 10/25/2022 1510   PLT 204  10/25/2022 1510   MCV 81.0 10/25/2022 1510   MCH 26.0 10/25/2022 1510   MCHC 32.0 10/25/2022 1510   RDW 13.3 10/25/2022 1510   LYMPHSABS 2.1 10/25/2022 1510   MONOABS 0.6 10/25/2022 1510   EOSABS 0.2 10/25/2022 1510   BASOSABS 0.1 10/25/2022 1510    CMP     Component Value Date/Time   NA 130 (L) 10/26/2022 0258   K 5.6 (H) 10/26/2022 0258   CL 103 10/26/2022 0258   CO2 21 (L) 10/26/2022 0258   GLUCOSE 99 10/26/2022 0258   BUN 19 10/26/2022 0258   CREATININE 1.36 (H) 10/26/2022 0258   CALCIUM 8.4 (L) 10/26/2022 0258   PROT 6.4 (L) 10/25/2022 1510   ALBUMIN 3.7 10/25/2022 1510   AST 25 10/25/2022 1510   ALT 11 10/25/2022 1510   ALKPHOS 49 10/25/2022 1510   BILITOT 0.8 10/25/2022 1510   GFRNONAA 41 (L) 10/26/2022 0258   GFRAA 56 (L) 10/21/2017 1316    glycosylated hemoglobin 5.3  Lipid Panel     Component Value Date/Time   CHOL 155 10/26/2022 0258   TRIG 41 10/26/2022 0258   HDL 48 10/26/2022 0258   CHOLHDL 3.2 10/26/2022 0258   VLDL 8 10/26/2022 0258   LDLCALC 99 10/26/2022 0258   Imaging I have reviewed images in epic and the results pertinent to this consultation are: CT head with a 3 mm right sided subdural hematoma. MRI brain again redemonstrates the 3 mm subdural hematoma.  No mass effect.  No midline shift CT angio head and neck-no emergent LVO.  Less than expected for age atherosclerosis.  2d echo-pending  Assessment:  77 year old woman, past history of pituitary adenoma status post transsphenoidal resection and radiation, hypothyroidism, CKD 3 presented for sudden onset of speech difficulties.  Had had a recent diagnosis of UTI day prior to presentation.  On CT head, noted to have a right small subdural hematoma without any mass effect or midline shift.  Symptoms have completely resolved Suspect a left hemispheric TIA based on the symptoms or toxic metabolic encephalopathy in the setting of UTI. She has been admitted for a quick TIA risk factor workup  which is underway.   Impression: 3 mm right-sided convexity subdural hematoma-stable on the MRI compared to the CT done few hours prior-likely subacute to chronic. Left hemispheric TIA versus toxic metabolic encephalopathy from UTI leading to word finding difficulty that has since resolved  Recommendations:  TIA/toxic metabolic encephalopathy A 2D echo is pending-please call if any concerning findings A1c goal less than 7-management per medicine LDL goal less than 70.  She is at 82.  I would start her on atorvastatin 40. See discussion on antiplatelets below-start aspirin 81. Management of any UTI per primary team.  SDH An isodense hematomas likely subacute to chronic.  I briefly ran the case with the on-call neurosurgery team who feel that it is safe to keep her on aspirin  81. She will need a close follow-up with repeat head CT in 2 weeks and follow-up with neurosurgery.  I spoke with Dr. Tamala Julian from neurosurgery-he is aware and their office will call to schedule.  I appreciate him taking time to discuss this case with me.  Plan was relayed to the attending Dr. Doristine Bosworth   -- Amie Portland, MD Neurologist [facility] Pager: [phone]

## 2022-10-26 NOTE — Discharge Summary (Signed)
Physician Discharge Summary  Brianna Hammond:[id number] DOB: 1945-10-29 DOA: 10/25/2022  PCP: Dion Body, MD  Admit date: 10/25/2022 Discharge date: 10/26/2022  Admitted From: Home Disposition:  Home  Recommendations for Outpatient Follow-up:  Follow up with PCP in 1-2 weeks Continue Bactrim as prescribed by your PCP for UTI Follow-up on urine culture result Follow-up with neurology in 6 to 8 weeks Follow-up with cardiology in 1 to 2 weeks to discuss about echo result-cardiology to call  tomorrow to scheduled appointment Follow-up with neurosurgery in the next 2 weeks with repeat imaging for subdural hematoma at Dr. Thompson Caul office to call for appointment Start aspirin 81 mg daily and atorvastatin 40 mg daily  Home Health: None Equipment/Devices: None Discharge Condition: Stable to CODE STATUS: Full code Diet recommendation: Heart healthy diet  Brief/Interim Summary: Brianna Hammond is a 77 y.o. female past medical history of pituitary adenoma status post transsphenoidal resection and radiation, hypothyroidism, chronic cortisol replacement, CKD 3, presented for evaluation of sudden onset of speech difficulties.   Patient was in her usual state of health until about 2:00 in the night around 2:15 PM, noticed she was having trouble getting her words out.  She could not say what she wanted to say.  This was noticed by her husband who drove her to the emergency room where on triage, she continued to have word-finding difficulties-code stroke was activated.  At triage also noted to have mild right upper extremity drift or weakness. She has recently been diagnosed with a UTI yesterday and started on antibiotics.  Upon arrival to ED:WBC 5.8, sodium 124, potassium 3.4, stable renal function, urinalysis (yellow appearance, negative leukocyte, rare bacteria, WBC 0-5).  Blood pressure 101/67, heart rate 89, RR 16, oxygen saturation 99% on room air.  CTA of head and neck negative for LVO.  Pt is placed on telemetry bed for observation.  Dr. Rory Percy of neurology is consulted. CT of the head: 1. Trace isodense subdural hematoma along the right cerebral convexity measuring up to 3 mm in thickness. 2. No visible infarct.  Patient admitted for further evaluation and management of subdural hematoma/TIA workup  TIA (transient ischemic attack):  -Reviewed CT head, MRI brain, CT angio head and neck -Neurology consulted-appreciate help -LDL: 99, neurology recommended to start on atorvastatin 40 mg daily -Echo shows ejection fraction of 50 to 55% with grade 2 diastolic dysfunction and left ventricle demonstrated regional wall motion abnormalities.  Discussed cardiology Dr. Clayborn Bigness via secure chat who recommended outpatient follow-up in 1 to 2 weeks.  Dr. Clayborn Bigness is aware and their office will call to schedule the patient for follow-up appointment -Okay to discharge from neurology standpoint recommend follow-up in 6 to 8 weeks -Will discharge patient on aspirin 81 mg and atorvastatin 40 mg once daily -PT/OT/SLP evaluated patient recommend no follow-up    SDH (subdural hematoma) [facility]):  -Reviewed CT head and MRI brain. -Neurology Dr. Hilbert Bible with neurosurgery on-call Dr. Tamala Julian who recommend to keep her on aspirin 81 mg daily and close follow-up with repeat CT head in 2 weeks and follow-up with neurosurgery.  Dr. Tamala Julian is aware and their office will call to schedule the patient for follow-up appointment   HLD (hyperlipidemia) -Discontinued parvastatin.  Started on atorvastatin 40 mg once daily   Acquired hypothyroidism -Continued Synthroid   Chronic kidney disease, stage 3a ([place]): Stable   Hyponatremia:  -Improved   hypokalemia: Potassium 3.4 -Repleted potassium  Hyperkalemia: Resolved   Pituitary adenoma [facility]): s/p of resection and radiation therapy -  Continue home Cortef 5 mg daily -Cortef 20 mg once today as stress dose upon admission   UTI: -continued  Bactrim -Follow-up urine culture   Discharge Diagnoses:  TIA Subdural hematoma Hyperlipidemia Acquired hypothyroidism CKD stage IIIa Hyponatremia  Pituitary adenoma s/p resection and radiation therapy UTI Hypokalemia Hyperkalemia  Discharge Instructions  Discharge Instructions     Diet - low sodium heart healthy   Complete by: As directed    Increase activity slowly   Complete by: As directed       Allergies as of 10/26/2022       Reactions   Dexamethasone Other (See Comments), Hives, Rash   Flushing, hives   Dilaudid [hydromorphone Hcl] Anaphylaxis   Apnea/ required narcan in pacu to reverse   Dexamethasone Sodium Phosphate Hives   Ezetimibe Other (See Comments)   Muscle pain    Latex Rash   And blisters Other reaction(s): Other (See Comments) And blisters        Medication List     STOP taking these medications    pravastatin 10 MG tablet Commonly known as: PRAVACHOL       TAKE these medications    aspirin EC 81 MG tablet Take 81 mg by mouth. Every other day   atorvastatin 40 MG tablet Commonly known as: Lipitor Take 1 tablet (40 mg total) by mouth daily.   calcium carbonate 500 MG chewable tablet Commonly known as: TUMS - dosed in mg elemental calcium Chew 1 tablet by mouth daily as needed for indigestion or heartburn.   CALCIUM CITRATE + D3 PO Take 1 tablet by mouth daily.   cyanocobalamin 1000 MCG tablet Commonly known as: VITAMIN B12 Take 1,000 mcg by mouth daily.   hydrocortisone 5 MG tablet Commonly known as: CORTEF Take 5 mg by mouth daily.   levothyroxine 50 MCG tablet Commonly known as: SYNTHROID Take 50 mcg by mouth daily.   ondansetron 4 MG disintegrating tablet Commonly known as: Zofran ODT Take 1 tablet (4 mg total) by mouth every 8 (eight) hours as needed for nausea or vomiting.   sulfamethoxazole-trimethoprim 800-160 MG tablet Commonly known as: BACTRIM DS Take 1 tablet by mouth 2 (two) times daily for 5 days.         Allergies  Allergen Reactions   Dexamethasone Other (See Comments), Hives and Rash    Flushing, hives   Dilaudid [Hydromorphone Hcl] Anaphylaxis    Apnea/ required narcan in pacu to reverse   Dexamethasone Sodium Phosphate Hives   Ezetimibe Other (See Comments)    Muscle pain    Latex Rash    And blisters Other reaction(s): Other (See Comments) And blisters    Consultations: Neurology Cardiology Neurosurgery   Procedures/Studies: ECHOCARDIOGRAM COMPLETE  Result Date: 10/26/2022    ECHOCARDIOGRAM REPORT   Patient Name:   Brianna Hammond Date of Exam: 10/26/2022 Medical Rec #:  [id number]        Height:       61.0 in Accession #:    [id number]       Weight:       114.0 lb Date of Birth:  03-Sep-1946        BSA:          1.487 m Patient Age:    77 years         BP:           94/63 mmHg Patient Gender: F  HR:           87 bpm. Exam Location:  [facility] Procedure: 2D Echo, Color Doppler, Cardiac Doppler and Intracardiac            Opacification Agent Indications:     TIA  History:         Patient has no prior history of Echocardiogram examinations. No                  Cardiac History.  Sonographer:     L. Thornton-Maynard Referring Phys:  [id number] Soledad Gerlach NIU Diagnosing Phys: Yolonda Kida MD IMPRESSIONS  1. Mild ant/apical hyo.  2. Left ventricular ejection fraction, by estimation, is 50 to 55%. The left ventricle has low normal function. The left ventricle demonstrates regional wall motion abnormalities (see scoring diagram/findings for description). Left ventricular diastolic  parameters are consistent with Grade II diastolic dysfunction (pseudonormalization).  3. Right ventricular systolic function is normal. The right ventricular size is normal.  4. There is no evidence of cardiac tamponade.  5. The mitral valve is normal in structure. Trivial mitral valve regurgitation.  6. The aortic valve is normal in structure. Aortic valve regurgitation is not visualized. FINDINGS  Left  Ventricle: Left ventricular ejection fraction, by estimation, is 50 to 55%. The left ventricle has low normal function. The left ventricle demonstrates regional wall motion abnormalities. Definity contrast agent was given IV to delineate the left ventricular endocardial borders. The left ventricular internal cavity size was normal in size. There is no left ventricular hypertrophy. Left ventricular diastolic parameters are consistent with Grade II diastolic dysfunction (pseudonormalization). Right Ventricle: The right ventricular size is normal. No increase in right ventricular wall thickness. Right ventricular systolic function is normal. Left Atrium: Left atrial size was normal in size. Right Atrium: Right atrial size was normal in size. Pericardium: Trivial pericardial effusion is present. The pericardial effusion is anterior to the right ventricle. There is no evidence of cardiac tamponade. Mitral Valve: The mitral valve is normal in structure. Trivial mitral valve regurgitation. MV peak gradient, 5.5 mmHg. The mean mitral valve gradient is 4.0 mmHg. Tricuspid Valve: The tricuspid valve is normal in structure. Tricuspid valve regurgitation is mild. Aortic Valve: The aortic valve is normal in structure. Aortic valve regurgitation is not visualized. Aortic valve mean gradient measures 4.0 mmHg. Aortic valve peak gradient measures 6.8 mmHg. Aortic valve area, by VTI measures 1.70 cm. Pulmonic Valve: The pulmonic valve was normal in structure. Pulmonic valve regurgitation is not visualized. Aorta: The ascending aorta was not well visualized. IAS/Shunts: No atrial level shunt detected by color flow Doppler. Additional Comments: Mild ant/apical hyo.  LEFT VENTRICLE PLAX 2D LVIDd:         4.30 cm     Diastology LVIDs:         2.60 cm     LV e' medial:    8.60 cm/s LV PW:         1.00 cm     LV E/e' medial:  14.1 LV IVS:        1.00 cm     LV e' lateral:   10.80 cm/s LVOT diam:     1.80 cm     LV E/e' lateral: 11.2 LV  SV:         39 LV SV Index:   27 LVOT Area:     2.54 cm  LV Volumes (MOD) LV vol d, MOD A2C: 57.7 ml LV vol d, MOD A4C: 60.0 ml LV  vol s, MOD A2C: 34.2 ml LV vol s, MOD A4C: 25.8 ml LV SV MOD A2C:     23.5 ml LV SV MOD A4C:     60.0 ml LV SV MOD BP:      31.8 ml RIGHT VENTRICLE RV Basal diam:  2.60 cm RV S prime:     14.50 cm/s TAPSE (M-mode): 1.5 cm LEFT ATRIUM             Index        RIGHT ATRIUM          Index LA diam:        2.90 cm 1.95 cm/m   RA Area:     9.22 cm LA Vol (A2C):   42.1 ml 28.30 ml/m  RA Volume:   16.90 ml 11.36 ml/m LA Vol (A4C):   43.6 ml 29.31 ml/m LA Biplane Vol: 44.6 ml 29.99 ml/m  AORTIC VALVE                    PULMONIC VALVE AV Area (Vmax):    1.59 cm     PV Vmax:       0.88 m/s AV Area (Vmean):   1.59 cm     PV Peak grad:  3.1 mmHg AV Area (VTI):     1.70 cm AV Vmax:           130.00 cm/s AV Vmean:          91.900 cm/s AV VTI:            0.232 m AV Peak Grad:      6.8 mmHg AV Mean Grad:      4.0 mmHg LVOT Vmax:         81.20 cm/s LVOT Vmean:        57.600 cm/s LVOT VTI:          0.155 m LVOT/AV VTI ratio: 0.67  AORTA Ao Root diam: 3.00 cm Ao Asc diam:  2.60 cm MITRAL VALVE MV Area (PHT): 5.31 cm     SHUNTS MV Area VTI:   1.60 cm     Systemic VTI:  0.16 m MV Peak grad:  5.5 mmHg     Systemic Diam: 1.80 cm MV Mean grad:  4.0 mmHg MV Vmax:       1.17 m/s MV Vmean:      89.8 cm/s MV Decel Time: 143 msec MV E velocity: 121.00 cm/s MV A velocity: 87.00 cm/s MV E/A ratio:  1.39 Dwayne Prince Rome MD Electronically signed by Yolonda Kida MD Signature Date/Time: 10/26/2022/3:16:33 PM    Final    MR BRAIN WO CONTRAST  Result Date: 10/26/2022 CLINICAL DATA:  Initial evaluation for acute TIA. EXAM: MRI HEAD WITHOUT CONTRAST TECHNIQUE: Multiplanar, multiecho pulse sequences of the brain and surrounding structures were obtained without intravenous contrast. COMPARISON:  Prior CT from earlier the same day. FINDINGS: Brain: Cerebral volume within normal limits. Mild scattered patchy  T2/FLAIR hyperintensity involving the periventricular deep white matter both cerebral hemispheres, most characteristic of chronic microvascular ischemic disease, minor in less than is typically seen for patient age. No evidence for acute or subacute ischemia. Gray-white matter differentiation maintained. No areas of chronic cortical infarction. No mass lesion. Small subdural hematoma overlying the right cerebral convexity again seen. This measures up to 3 mm in thickness without significant mass effect or midline shift. No hydrocephalus. Basilar cisterns remain patent. No made of a partially empty sella. Suprasellar region normal. Vascular: Major  intracranial vascular flow voids are maintained. Skull and upper cervical spine: Craniocervical junction within normal limits. Bone marrow signal intensity normal. No scalp soft tissue abnormality. Sinuses/Orbits: Globes orbital soft tissues within normal limits. Scattered mucosal thickening noted about the sphenoid ethmoidal and maxillary sinuses. No significant mastoid effusion. Other: None. IMPRESSION: 1. 3 mm subdural hematoma overlying the right cerebral convexity without significant mass effect or midline shift. 2. No other acute intracranial abnormality. 3. Minor chronic microvascular ischemic disease for age. Electronically Signed   By: Jeannine Boga M.D.   On: 10/26/2022 00:23   CT ANGIO HEAD NECK W WO CM (CODE STROKE)  Result Date: 10/25/2022 CLINICAL DATA:  Word-finding difficulty EXAM: CT ANGIOGRAPHY HEAD AND NECK TECHNIQUE: Multidetector CT imaging of the head and neck was performed using the standard protocol during bolus administration of intravenous contrast. Multiplanar CT image reconstructions and MIPs were obtained to evaluate the vascular anatomy. Carotid stenosis measurements (when applicable) are obtained utilizing NASCET criteria, using the distal internal carotid diameter as the denominator. RADIATION DOSE REDUCTION: This exam was  performed according to the departmental dose-optimization program which includes automated exposure control, adjustment of the mA and/or kV according to patient size and/or use of iterative reconstruction technique. CONTRAST:  30m OMNIPAQUE IOHEXOL 350 MG/ML SOLN COMPARISON:  Head CT from earlier today FINDINGS: CTA NECK FINDINGS Aortic arch: Normal with 3 vessel branching. Right carotid system: Vessels are smoothly contoured and widely patent. Left carotid system: Vessels are smoothly contoured and widely patent. Ridge/kink in the proximal common carotid in the setting of tortuosity. Vertebral arteries: No proximal subclavian stenosis. Left dominant vertebral artery. No beading, dissection, or stenosis Skeleton: No acute finding. Other neck: Chronically opacified right posterior ethmoid air cells. Prior endoscopic sinus surgery, reportedly for pituitary mass resection. Upper chest: Clear apical lungs Review of the MIP images confirms the above findings CTA HEAD FINDINGS Anterior circulation: Vessels are smoothly contoured and widely patent. No branch occlusion, beading, or aneurysm Posterior circulation: Left dominant vertebral artery. Vertebral and basilar arteries are smoothly contoured and widely patent. No branch occlusion, beading, or aneurysm. Venous sinuses: Unremarkable Anatomic variants: None significant Review of the MIP images confirms the above findings IMPRESSION: No emergent finding.  Less than typical atherosclerosis for age. Electronically Signed   By: JJorje GuildM.D.   On: 10/25/2022 15:53   CT HEAD CODE STROKE WO CONTRAST  Result Date: 10/25/2022 CLINICAL DATA:  Code stroke.  Aphasia, difficulty finding words. EXAM: CT HEAD WITHOUT CONTRAST TECHNIQUE: Contiguous axial images were obtained from the base of the skull through the vertex without intravenous contrast. RADIATION DOSE REDUCTION: This exam was performed according to the departmental dose-optimization program which includes  automated exposure control, adjustment of the mA and/or kV according to patient size and/or use of iterative reconstruction technique. COMPARISON:  Brain MRI and 07/01/2021 FINDINGS: Brain: Thin subdural hematoma along the right cerebral convexity, isodense and 3 mm in thickness. No associated mass effect. No evidence of infarct, hydrocephalus, mass, or brain atrophy. Vascular: No hyperdense vessel or unexpected calcification. Skull: Normal. Negative for fracture or focal lesion. Sinuses/Orbits: Negative. Other: These results were called by telephone at the time of interpretation on 10/25/2022 at 3:28 pm to provider Dr ARory Percy, who verbally acknowledged these results. ASPECTS ([facility] Score) - Ganglionic level infarction (caudate, lentiform nuclei, internal capsule, insula, M1-M3 cortex): 7 - Supraganglionic infarction (M4-M6 cortex): 3 Total score (0-10 with 10 being normal): 10 IMPRESSION: 1. Trace isodense subdural hematoma along  the right cerebral convexity measuring up to 3 mm in thickness. 2. No visible infarct. Electronically Signed   By: Jorje Guild M.D.   On: 10/25/2022 15:29      Subjective: Patient seen and examined in ED.  Resting comfortably on the bed.  Reports that now she is back to her baseline.  Denies headache, blurry vision, slurred speech, weakness in upper or lower extremity.  Reports that she started on Bactrim by her PCP for UTI.  Discharge Exam: Vitals:   10/26/22 1317 10/26/22 1400  BP: 91/60 94/72  Pulse:    Resp: 18 11  Temp:    SpO2:     Vitals:   10/26/22 0800 10/26/22 0830 10/26/22 1317 10/26/22 1400  BP: 91/65 1'04/71 91/60 94/72 '$  Pulse: 92 (!) 116    Resp: '18 16 18 11  '$ Temp:      TempSrc:      SpO2: 100% (!) 74%    Weight:      Height:        General: Pt is alert, awake, not in acute distress, on room air, communicating well Cardiovascular: RRR, S1/S2 +, no rubs, no gallops Respiratory: CTA bilaterally, no wheezing, no  rhonchi Abdominal: Soft, NT, ND, bowel sounds + Extremities: no edema, no cyanosis    The results of significant diagnostics from this hospitalization (including imaging, microbiology, ancillary and laboratory) are listed below for reference.     Microbiology: Recent Results (from the past 240 hour(s))  Microscopic Examination     Status: Abnormal   Collection Time: 10/24/22  1:47 PM   Urine  Result Value Ref Range Status   WBC, UA 11-30 (A) 0 - 5 /hpf Final   RBC, Urine >30 (A) 0 - 2 /hpf Final   Epithelial Cells (non renal) 0-10 0 - 10 /hpf Final   Casts Present (A) None seen /lpf Final   Cast Type Hyaline casts N/A Final   Bacteria, UA Moderate (A) None seen/Few Final     Labs: BNP (last 3 results) No results for input(s): "BNP" in the last 8760 hours. Basic Metabolic Panel: Recent Labs  Lab 10/25/22 1510 10/25/22 1739 10/25/22 1810 10/26/22 0258 10/26/22 1138  NA 124*  --  129* 130* 134*  K 3.4*  --  3.5 5.6* 4.5  CL 95*  --  100 103 110  CO2 20*  --  20* 21* 21*  GLUCOSE 89  --  87 99 87  BUN 21  --  '21 19 17  '$ CREATININE 1.33*  --  1.31* 1.36* 1.17*  CALCIUM 8.7*  --  8.8* 8.4* 8.0*  MG  --  1.8  --   --   --   PHOS  --  3.3  --   --   --    Liver Function Tests: Recent Labs  Lab 10/25/22 1510  AST 25  ALT 11  ALKPHOS 49  BILITOT 0.8  PROT 6.4*  ALBUMIN 3.7   No results for input(s): "LIPASE", "AMYLASE" in the last 168 hours. No results for input(s): "AMMONIA" in the last 168 hours. CBC: Recent Labs  Lab 10/25/22 1510  WBC 5.8  NEUTROABS 2.8  HGB 11.5*  HCT 35.9*  MCV 81.0  PLT 204   Cardiac Enzymes: No results for input(s): "CKTOTAL", "CKMB", "CKMBINDEX", "TROPONINI" in the last 168 hours. BNP: Invalid input(s): "POCBNP" CBG: Recent Labs  Lab 10/25/22 1511  GLUCAP 80   D-Dimer No results for input(s): "DDIMER" in the last 72 hours. Hgb  A1c Recent Labs    10/25/22 1825  HGBA1C 5.3   Lipid Profile Recent Labs     10/26/22 0258  CHOL 155  HDL 48  LDLCALC 99  TRIG 41  CHOLHDL 3.2   Thyroid function studies No results for input(s): "TSH", "T4TOTAL", "T3FREE", "THYROIDAB" in the last 72 hours.  Invalid input(s): "FREET3" Anemia work up No results for input(s): "VITAMINB12", "FOLATE", "FERRITIN", "TIBC", "IRON", "RETICCTPCT" in the last 72 hours. Urinalysis    Component Value Date/Time   COLORURINE YELLOW (A) 10/25/2022 1619   APPEARANCEUR CLEAR (A) 10/25/2022 1619   APPEARANCEUR Clear 10/24/2022 1347   LABSPEC 1.040 (H) 10/25/2022 1619   PHURINE 5.0 10/25/2022 1619   GLUCOSEU NEGATIVE 10/25/2022 1619   HGBUR LARGE (A) 10/25/2022 1619   BILIRUBINUR NEGATIVE 10/25/2022 1619   BILIRUBINUR Negative 10/24/2022 1347   KETONESUR 5 (A) 10/25/2022 1619   PROTEINUR NEGATIVE 10/25/2022 1619   NITRITE NEGATIVE 10/25/2022 1619   LEUKOCYTESUR NEGATIVE 10/25/2022 1619   Sepsis Labs Recent Labs  Lab 10/25/22 1510  WBC 5.8   Microbiology Recent Results (from the past 240 hour(s))  Microscopic Examination     Status: Abnormal   Collection Time: 10/24/22  1:47 PM   Urine  Result Value Ref Range Status   WBC, UA 11-30 (A) 0 - 5 /hpf Final   RBC, Urine >30 (A) 0 - 2 /hpf Final   Epithelial Cells (non renal) 0-10 0 - 10 /hpf Final   Casts Present (A) None seen /lpf Final   Cast Type Hyaline casts N/A Final   Bacteria, UA Moderate (A) None seen/Few Final     Time coordinating discharge: Over 30 minutes  SIGNED:   Mckinley Jewel, MD  [facility] 10/26/2022, 3:28 PM Pager   If 7PM-7AM, please contact night-coverage [email or web address]

## 2022-10-26 NOTE — Progress Notes (Signed)
2D echo completed IMPRESSIONS   1. Mild ant/apical hyo.   2. Left ventricular ejection fraction, by estimation, is 50 to 55%. The  left ventricle has low normal function. The left ventricle demonstrates  regional wall motion abnormalities (see scoring diagram/findings for  description). Left ventricular diastolic   parameters are consistent with Grade II diastolic dysfunction  (pseudonormalization).   3. Right ventricular systolic function is normal. The right ventricular  size is normal.   4. There is no evidence of cardiac tamponade.   5. The mitral valve is normal in structure. Trivial mitral valve  regurgitation.   6. The aortic valve is normal in structure. Aortic valve regurgitation is  not visualized.   LA size normal No IAS/shunt by color Doppler   Recommended consultation with cardiology who recommends outpatient follow-up.  Follow-up with outpatient neurology in 6 to 8 weeks.  Follow-up with outpatient neurosurgery in the next 2 weeks with repeat imaging for the subdural-Dr. Smith's office to call for appointment.  -- Amie Portland, MD Neurologist [facility] Pager: [phone]

## 2022-10-26 NOTE — Progress Notes (Signed)
OT Cancellation Note  Patient Details Name: Brianna Hammond MRN: [id number] DOB: 1946/07/25   Cancelled Treatment:    Reason Eval/Treat Not Completed: OT screened, no needs identified, will sign off. OT orders received, chart reviewed. Pt reports all symptoms have resolved. Appears to be at baseline. Pt agrees. Denies any ADL/IADL concerns. No skilled OT needs indicated at this time. Please re-consult if there are any acute changes.     Lanelle Bal  [facility] 10/26/2022, 10:03 AM

## 2022-10-26 NOTE — ED Notes (Signed)
Advised nurse that patient has ready bed 

## 2022-10-26 NOTE — Progress Notes (Signed)
SLP Cancellation Note  Patient Details Name: Brianna Hammond MRN: [id number] DOB: 1946/04/17   Cancelled treatment:       Reason Eval/Treat Not Completed: SLP screened, no needs identified, will sign off   SLP consult received and appreciated. Chart review completed. Per chart review, pt's s/sx have resolved and pt at baseline. NIHSS=0. Please reconsult if acute changes to speech/language/cognition or swallowing noted.  Cherrie Gauze, M.S., [facility] [phone] ([place])  Quintella Baton 10/26/2022, 11:02 AM

## 2022-10-26 NOTE — Progress Notes (Signed)
PT Cancellation Note  Patient Details Name: Brianna Hammond MRN: [id number] DOB: 1946-03-04   Cancelled Treatment:    Reason Eval/Treat Not Completed: PT screened, no needs identified, will sign off (Pt reports full resolution of symptoms. She denies any evidence or sense of changes in motor function, leg strength, balance, basic mobility. Pt reports she has been AMB to BR in ED ad lib, no difficulty. No acute PT needs indicated at this time, PT out.)  11:32 AM, 10/26/22 Etta Grandchild, PT, DPT Physical Therapist - [facility]  [phone] ([place])    Sheila Ocasio C 10/26/2022, 11:32 AM

## 2022-10-27 LAB — URINE CULTURE: Culture: <10000 — AB

## 2022-10-28 LAB — CULTURE, URINE COMPREHENSIVE

## 2022-10-30 ENCOUNTER — Other Ambulatory Visit: Payer: Self-pay | Admitting: Internal Medicine

## 2022-10-30 DIAGNOSIS — R931 Abnormal findings on diagnostic imaging of heart and coronary circulation: Secondary | ICD-10-CM | POA: Diagnosis not present

## 2022-10-30 DIAGNOSIS — E871 Hypo-osmolality and hyponatremia: Secondary | ICD-10-CM | POA: Diagnosis not present

## 2022-10-30 DIAGNOSIS — G459 Transient cerebral ischemic attack, unspecified: Secondary | ICD-10-CM | POA: Diagnosis not present

## 2022-10-30 DIAGNOSIS — E7800 Pure hypercholesterolemia, unspecified: Secondary | ICD-10-CM | POA: Diagnosis not present

## 2022-10-30 DIAGNOSIS — R0989 Other specified symptoms and signs involving the circulatory and respiratory systems: Secondary | ICD-10-CM | POA: Diagnosis not present

## 2022-10-30 DIAGNOSIS — N183 Chronic kidney disease, stage 3 unspecified: Secondary | ICD-10-CM | POA: Diagnosis not present

## 2022-10-30 DIAGNOSIS — Z87898 Personal history of other specified conditions: Secondary | ICD-10-CM | POA: Diagnosis not present

## 2022-11-05 ENCOUNTER — Telehealth: Payer: Self-pay

## 2022-11-05 ENCOUNTER — Other Ambulatory Visit: Payer: Self-pay | Admitting: Physician Assistant

## 2022-11-05 DIAGNOSIS — S065XAA Traumatic subdural hemorrhage with loss of consciousness status unknown, initial encounter: Secondary | ICD-10-CM

## 2022-11-05 DIAGNOSIS — R300 Dysuria: Secondary | ICD-10-CM

## 2022-11-05 NOTE — Telephone Encounter (Signed)
The hospital called Dr Tamala Julian about this patient on 10/26/22: "skim SDH, non acute - patient with TIA and started on ASA. Will need follow up head ct and check in in 2 weeks - no formal consult"  Order placed for head CT - to be completed around 11/09/22. Will need new pt appt with our office after head CT.

## 2022-11-05 NOTE — Telephone Encounter (Signed)
Left message to call back

## 2022-11-06 NOTE — Telephone Encounter (Signed)
Left message to call back

## 2022-11-10 DIAGNOSIS — I6523 Occlusion and stenosis of bilateral carotid arteries: Secondary | ICD-10-CM | POA: Diagnosis not present

## 2022-11-10 DIAGNOSIS — R0989 Other specified symptoms and signs involving the circulatory and respiratory systems: Secondary | ICD-10-CM | POA: Diagnosis not present

## 2022-11-10 DIAGNOSIS — I771 Stricture of artery: Secondary | ICD-10-CM | POA: Diagnosis not present

## 2022-11-10 NOTE — Telephone Encounter (Signed)
Left message to call back  

## 2022-11-11 NOTE — Telephone Encounter (Signed)
Left message to call back  

## 2022-11-12 DIAGNOSIS — E871 Hypo-osmolality and hyponatremia: Secondary | ICD-10-CM | POA: Diagnosis not present

## 2022-11-12 NOTE — Telephone Encounter (Signed)
Should be ok to schedule with Stacy right.

## 2022-11-14 ENCOUNTER — Ambulatory Visit
Admission: RE | Admit: 2022-11-14 | Discharge: 2022-11-14 | Disposition: A | Discharge status: Home / Self Care | Payer: PPO | Source: Ambulatory Visit | Attending: Orthopedic Surgery | Admitting: Orthopedic Surgery

## 2022-11-14 DIAGNOSIS — S065XAA Traumatic subdural hemorrhage with loss of consciousness status unknown, initial encounter: Secondary | ICD-10-CM | POA: Diagnosis not present

## 2022-11-14 DIAGNOSIS — I6200 Nontraumatic subdural hemorrhage, unspecified: Secondary | ICD-10-CM | POA: Diagnosis not present

## 2022-11-14 NOTE — Telephone Encounter (Signed)
Left message to call back  

## 2022-11-17 DIAGNOSIS — E7800 Pure hypercholesterolemia, unspecified: Secondary | ICD-10-CM | POA: Diagnosis not present

## 2022-11-17 DIAGNOSIS — N183 Chronic kidney disease, stage 3 unspecified: Secondary | ICD-10-CM | POA: Diagnosis not present

## 2022-11-17 DIAGNOSIS — R931 Abnormal findings on diagnostic imaging of heart and coronary circulation: Secondary | ICD-10-CM | POA: Diagnosis not present

## 2022-11-17 DIAGNOSIS — G459 Transient cerebral ischemic attack, unspecified: Secondary | ICD-10-CM | POA: Diagnosis not present

## 2022-11-19 ENCOUNTER — Telehealth (HOSPITAL_COMMUNITY): Payer: Self-pay | Admitting: Emergency Medicine

## 2022-11-19 ENCOUNTER — Other Ambulatory Visit (HOSPITAL_COMMUNITY): Payer: Self-pay | Admitting: Emergency Medicine

## 2022-11-19 ENCOUNTER — Encounter (HOSPITAL_COMMUNITY): Payer: Self-pay

## 2022-11-19 DIAGNOSIS — R079 Chest pain, unspecified: Secondary | ICD-10-CM

## 2022-11-19 MED ORDER — METOPROLOL TARTRATE 50 MG PO TABS: 50.0000 mg | ORAL_TABLET | Freq: Once | ORAL | 0 refills | Status: DC

## 2022-11-19 NOTE — Telephone Encounter (Signed)
Attempted to call patient regarding upcoming cardiac CT appointment. °Left message on voicemail with name and callback number °Shonice Wrisley RN Navigator Cardiac Imaging °[facility] °[phone] Office °[phone] Cell ° °

## 2022-11-19 NOTE — Telephone Encounter (Signed)
Mailbox is full - unable to leave message.

## 2022-11-20 ENCOUNTER — Ambulatory Visit
Admission: RE | Admit: 2022-11-20 | Discharge: 2022-11-20 | Disposition: A | Discharge status: Home / Self Care | Payer: PPO | Source: Ambulatory Visit | Attending: Internal Medicine | Admitting: Internal Medicine

## 2022-11-20 DIAGNOSIS — Z905 Acquired absence of kidney: Secondary | ICD-10-CM | POA: Diagnosis not present

## 2022-11-20 DIAGNOSIS — E7800 Pure hypercholesterolemia, unspecified: Secondary | ICD-10-CM | POA: Diagnosis not present

## 2022-11-20 DIAGNOSIS — R931 Abnormal findings on diagnostic imaging of heart and coronary circulation: Secondary | ICD-10-CM | POA: Diagnosis not present

## 2022-11-20 DIAGNOSIS — N183 Chronic kidney disease, stage 3 unspecified: Secondary | ICD-10-CM | POA: Insufficient documentation

## 2022-11-20 DIAGNOSIS — E785 Hyperlipidemia, unspecified: Secondary | ICD-10-CM | POA: Insufficient documentation

## 2022-11-20 DIAGNOSIS — Z7982 Long term (current) use of aspirin: Secondary | ICD-10-CM | POA: Insufficient documentation

## 2022-11-20 DIAGNOSIS — E871 Hypo-osmolality and hyponatremia: Secondary | ICD-10-CM | POA: Diagnosis not present

## 2022-11-20 DIAGNOSIS — R079 Chest pain, unspecified: Secondary | ICD-10-CM | POA: Insufficient documentation

## 2022-11-20 DIAGNOSIS — I209 Angina pectoris, unspecified: Secondary | ICD-10-CM | POA: Insufficient documentation

## 2022-11-20 DIAGNOSIS — Z8673 Personal history of transient ischemic attack (TIA), and cerebral infarction without residual deficits: Secondary | ICD-10-CM | POA: Insufficient documentation

## 2022-11-20 DIAGNOSIS — R9430 Abnormal result of cardiovascular function study, unspecified: Secondary | ICD-10-CM | POA: Diagnosis not present

## 2022-11-20 MED ORDER — NITROGLYCERIN 0.4 MG SL SUBL
0.8000 mg | SUBLINGUAL_TABLET | Freq: Once | SUBLINGUAL | Status: AC
  Administered 2022-11-20: 0.8 mg via SUBLINGUAL

## 2022-11-20 MED ORDER — IOHEXOL 350 MG/ML SOLN
75.0000 mL | Freq: Once | INTRAVENOUS | Status: AC | PRN
  Administered 2022-11-20: 75 mL via INTRAVENOUS

## 2022-11-20 NOTE — Progress Notes (Signed)
Patient tolerated CT well. Drank water after. Vital signs stable encourage to drink water throughout day.Reasons explained and verbalized understanding. Ambulated steady gait.  

## 2022-11-21 NOTE — Telephone Encounter (Signed)
Left message to call back  

## 2022-11-25 NOTE — Telephone Encounter (Signed)
Left message to call back  

## 2022-12-02 NOTE — Telephone Encounter (Signed)
I have not been able to get in contact with the patient. Can you send her a mychart message or would you like me to continue to try to reach her?

## 2022-12-02 NOTE — Telephone Encounter (Signed)
I sent her a MyChart message. It will notify me on Friday if she has not read it. Will let you know.

## 2022-12-03 NOTE — Telephone Encounter (Signed)
I sent her a mychart message with an appt date. Waiting in her response.

## 2022-12-03 NOTE — Telephone Encounter (Signed)
Appt confirmed 12/11/22

## 2022-12-03 NOTE — Telephone Encounter (Signed)
See her MyChart message. Can you please reach back out to her or send her an appointment on MyChart?   Have her see me on a day Brianna Hammond is here.   Thanks.

## 2022-12-07 NOTE — Progress Notes (Deleted)
Referring Physician:  Haroldine Laws, [address] [place],  [place] [address]  Primary Physician:  Dion Body, MD  History of Present Illness: 12/07/2022*** [name] has a history of pituitary adenoma status post transsphenoidal resection and radiation, hypothyroidism, chronic cortisol replacement, and CKD 3.   She was seen in ED and admitted on 10/25/22 for subdural hematoma/TIA.   Dr. Tamala Julian was called regarding SDH seen on imaging. He recommended she have follow up head CT in 2 weeks. She was to continue her aspirin.   She is here to review this.    Review CT with Brianna Hammond?***   Duration: *** Location: *** Quality: *** Severity: ***  Precipitating: aggravated by *** Modifying factors: made better by *** Weakness: none Timing: *** Bowel/Bladder Dysfunction: none  Conservative measures:  Physical therapy: ***  Multimodal medical therapy including regular antiinflammatories: ***  Injections: *** epidural steroid injections  Past Surgery: ***  Brianna Hammond has ***no symptoms of cervical myelopathy.  The symptoms are causing a significant impact on the patient's life.   Review of Systems:  A 10 point review of systems is negative, except for the pertinent positives and negatives detailed in the HPI.  Past Medical History: Past Medical History:  Diagnosis Date   Acquired hypothyroidism 04/04/2016   Age-related osteoporosis without current pathological fracture 09/29/2016   Arthritis    Benign neoplasm of pituitary gland and craniopharyngeal duct ([place]) 11/15/2012   Calculus of kidney 11/15/2012   Chronic renal failure    Fibrocystic breast disease    GERD (gastroesophageal reflux disease)    Hepatitis A    age 20   History of kidney stones    History of pituitary tumor 08/14/2017   Overview:  s/p removal   History of radiation therapy 1/25, 1/27, 1/29, 2/2, 11/15/14   SRS pituitary adenoma 5 Gy in 5 fx, total 25 Gy   Hypothyroid     Infection of urinary tract 11/15/2012   Kidney calculus    Low serum cortisol level 09/10/2016   Microscopic hematuria 11/15/2012   Morgagni hernia    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   Osteoporosis    Pituitary adenoma ([place]) 2005   Pituitary adenoma ([place]) 10/30/2014   Pure hypercholesterolemia 04/04/2016   Small kidney, unilateral 12/20/2014   TMJ syndrome    Trigeminal herpes zoster    Vaccine counseling 09/10/2016    Past Surgical History: Past Surgical History:  Procedure Laterality Date   BRAIN SURGERY     BREAST BIOPSY Left 1986   neg   COLONOSCOPY     HERNIA REPAIR     morgagnia   HIATAL HERNIA REPAIR N/A 05/01/2016   Procedure: LAPAROSCOPIC REPAIR OF MORGAGNI HERNIA;  Surgeon: Jules Husbands, MD;  Location: [facility] ORS;  Service: General;  Laterality: N/A;   INGUINAL HERNIA REPAIR Bilateral 10/28/2017   Procedure: LAPAROSCOPIC BILATERAL INGUINAL HERNIA REPAIR;  Surgeon: Jules Husbands, MD;  Location: [facility] ORS;  Service: General;  Laterality: Bilateral;   NEPHRECTOMY RADICAL Left 08/2003   for metanephric adenoma   TONSILLECTOMY     age 72   TRANSPHENOIDAL PITUITARY RESECTION  03/14/2004   infarcted pituitary tumor with features most suggestive of pituitary adenoma    Allergies: Allergies as of 12/11/2022 - Review Complete 11/20/2022  Allergen Reaction Noted   Dexamethasone Other (See Comments), Hives, and Rash 06/30/2012   Dilaudid [hydromorphone hcl] Anaphylaxis 10/21/2017   Dexamethasone sodium phosphate Hives 09/04/2014   Ezetimibe Other (  See Comments) 06/22/2017   Latex Rash 04/17/2016    Medications: Outpatient Encounter Medications as of 12/11/2022  Medication Sig   aspirin EC 81 MG tablet Take 81 mg by mouth. Every other day   atorvastatin (LIPITOR) 40 MG tablet Take 1 tablet (40 mg total) by mouth daily.   calcium carbonate (TUMS - DOSED IN MG ELEMENTAL CALCIUM) 500 MG chewable tablet Chew 1 tablet by mouth daily as needed for indigestion  or heartburn.   Calcium Citrate-Vitamin D (CALCIUM CITRATE + D3 PO) Take 1 tablet by mouth daily.   hydrocortisone (CORTEF) 5 MG tablet Take 5 mg by mouth daily.   levothyroxine (SYNTHROID, LEVOTHROID) 50 MCG tablet Take 50 mcg by mouth daily.    metoprolol tartrate (LOPRESSOR) 50 MG tablet Take 1 tablet (50 mg total) by mouth once for 1 dose. Take 2 hr prior to CT scan   ondansetron (ZOFRAN ODT) 4 MG disintegrating tablet Take 1 tablet (4 mg total) by mouth every 8 (eight) hours as needed for nausea or vomiting. (Patient not taking: Reported on 10/25/2022)   vitamin B-12 (CYANOCOBALAMIN) 1000 MCG tablet Take 1,000 mcg by mouth daily.   No facility-administered encounter medications on file as of 12/11/2022.    Social History: Social History   Tobacco Use   Smoking status: Never    Passive exposure: Never   Smokeless tobacco: Never  Vaping Use   Vaping Use: Never used  Substance Use Topics   Alcohol use: Yes    Comment: socially, rarely   Drug use: No    Family Medical History: Family History  Problem Relation Age of Onset   Cancer Mother        liver   Lymphoma Mother    Breast cancer Neg Hx     Physical Examination: There were no vitals filed for this visit.  General: Patient is well developed, well nourished, calm, collected, and in no apparent distress. Attention to examination is appropriate.  Respiratory: Patient is breathing without any difficulty.   NEUROLOGICAL:     Awake, alert, oriented to person, place, and time.  Speech is clear and fluent. Fund of knowledge is appropriate.   Cranial Nerves: Pupils equal round and reactive to light.  Facial tone is symmetric.    *** ROM of cervical spine *** pain *** posterior cervical tenderness. *** tenderness in bilateral trapezial region.   *** ROM of lumbar spine *** pain *** posterior lumbar tenderness.   No abnormal lesions on exposed skin.   Strength: Side Biceps Triceps Deltoid Interossei Grip Wrist Ext.  Wrist Flex.  R '5 5 5 5 5 5 5  '$ L '5 5 5 5 5 5 5   '$ Side Iliopsoas Quads Hamstring PF DF EHL  R '5 5 5 5 5 5  '$ L '5 5 5 5 5 5   '$ Reflexes are ***2+ and symmetric at the biceps, triceps, brachioradialis, patella and achilles.   Hoffman's is absent.  Clonus is not present.   Bilateral upper and lower extremity sensation is intact to light touch.     Gait is normal.   ***No difficulty with tandem gait.    Medical Decision Making  Imaging: CT of head dated 11/14/22:  FINDINGS: Brain: 3 mm right-sided cerebral convexity subdural hematoma has not significantly changed in size, but is slightly more low density. There is no mass effect or midline shift. There is no new acute intracranial hemorrhage. Ventricles are normal in size. No acute infarct identified.   Vascular: No hyperdense  vessel or unexpected calcification.   Skull: Normal. Negative for fracture or focal lesion.   Sinuses/Orbits: No acute finding.   Other: None.   IMPRESSION: Unchanged 3 mm right cerebral convexity subdural hematoma. No mass effect or midline shift.     Electronically Signed   By: Ronney Asters M.D.   On: 11/14/2022 16:09  I have personally reviewed the images and agree with the above interpretation.  Above imaging also reviewed with Dr. Izora Hammond. ***  Assessment and Plan: Ms. Weedon is a pleasant 77 y.o. female has ***  Treatment options discussed with patient and following plan made:   - Order for physical therapy for *** spine ***. Patient to call to schedule appointment. *** - Continue current medications including ***. Reviewed dosing and side effects.  - Prescription for ***. Reviewed dosing and side effects. Take with food.  - Prescription for *** to take prn muscle spasms. Reviewed dosing and side effects. Discussed this can cause drowsiness.  - MRI of *** to further evaluate *** radiculopathy. No improvement time or medications (***).  - Referral to PMR at [facility] to discuss possible ***  injections.  - Will schedule phone visit to review MRI results once I get them back.   I spent a total of *** minutes in face-to-face and non-face-to-face activities related to this patient's care today including review of outside records, review of imaging, review of symptoms, physical exam, discussion of differential diagnosis, discussion of treatment options, and documentation.   Thank you for involving me in the care of this patient.   Geronimo Boot PA-C Dept. of Neurosurgery

## 2022-12-09 DIAGNOSIS — H0015 Chalazion left lower eyelid: Secondary | ICD-10-CM | POA: Diagnosis not present

## 2022-12-11 ENCOUNTER — Ambulatory Visit: Payer: PPO | Admitting: Orthopedic Surgery

## 2022-12-12 NOTE — Progress Notes (Unsigned)
Referring Physician:  No referring provider defined for this encounter.  Primary Physician:  Dion Body, MD  History of Present Illness: 12/16/2022 Ms. Tangila Nelle has a history of pituitary adenoma status post transsphenoidal resection and radiation, hypothyroidism, chronic cortisol replacement, and CKD 3.   She was seen in ED and admitted on 10/25/22 for subdural hematoma/TIA.   Dr. Tamala Julian was called regarding SDH seen on imaging. He recommended she have follow up head CT in 2 weeks. She was to continue her aspirin.   She is here to review this.   Since being discharged, she has noted one intermittent dull headache that improved with tylenol. No dizziness or blurry vision. Overall, she is doing well.   As above, she has history of pituitary adenoma status post transsphenoidal resection and radiation in 2005. [facility] for this.   Review of Systems:  A 10 point review of systems is negative, except for the pertinent positives and negatives detailed in the HPI.  Past Medical History: Past Medical History:  Diagnosis Date   Acquired hypothyroidism 04/04/2016   Age-related osteoporosis without current pathological fracture 09/29/2016   Arthritis    Benign neoplasm of pituitary gland and craniopharyngeal duct ([place]) 11/15/2012   Calculus of kidney 11/15/2012   Chronic renal failure    Fibrocystic breast disease    GERD (gastroesophageal reflux disease)    Hepatitis A    age 50   History of kidney stones    History of pituitary tumor 08/14/2017   Overview:  s/p removal   History of radiation therapy 1/25, 1/27, 1/29, 2/2, 11/15/14   SRS pituitary adenoma 5 Gy in 5 fx, total 25 Gy   Hypothyroid    Infection of urinary tract 11/15/2012   Kidney calculus    Low serum cortisol level 09/10/2016   Microscopic hematuria 11/15/2012   Morgagni hernia    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   Osteoporosis    Pituitary adenoma ([place]) 2005    Pituitary adenoma ([place]) 10/30/2014   Pure hypercholesterolemia 04/04/2016   Small kidney, unilateral 12/20/2014   TMJ syndrome    Trigeminal herpes zoster    Vaccine counseling 09/10/2016    Past Surgical History: Past Surgical History:  Procedure Laterality Date   BRAIN SURGERY     BREAST BIOPSY Left 1986   neg   COLONOSCOPY     HERNIA REPAIR     morgagnia   HIATAL HERNIA REPAIR N/A 05/01/2016   Procedure: LAPAROSCOPIC REPAIR OF MORGAGNI HERNIA;  Surgeon: Jules Husbands, MD;  Location: [facility] ORS;  Service: General;  Laterality: N/A;   INGUINAL HERNIA REPAIR Bilateral 10/28/2017   Procedure: LAPAROSCOPIC BILATERAL INGUINAL HERNIA REPAIR;  Surgeon: Jules Husbands, MD;  Location: [facility] ORS;  Service: General;  Laterality: Bilateral;   NEPHRECTOMY RADICAL Left 08/2003   for metanephric adenoma   TONSILLECTOMY     age 45   TRANSPHENOIDAL PITUITARY RESECTION  03/14/2004   infarcted pituitary tumor with features most suggestive of pituitary adenoma    Allergies: Allergies as of 12/16/2022 - Review Complete 12/16/2022  Allergen Reaction Noted   Dexamethasone Other (See Comments), Hives, and Rash 06/30/2012   Dilaudid [hydromorphone hcl] Anaphylaxis 10/21/2017   Dexamethasone sodium phosphate Hives 09/04/2014   Ezetimibe Other (See Comments) 06/22/2017   Latex Rash 04/17/2016    Medications: Outpatient Encounter Medications as of 12/16/2022  Medication Sig   aspirin EC 81 MG tablet Take 81 mg by mouth daily. Every other day  calcium carbonate (TUMS - DOSED IN MG ELEMENTAL CALCIUM) 500 MG chewable tablet Chew 1 tablet by mouth daily as needed for indigestion or heartburn.   Calcium Citrate-Vitamin D (CALCIUM CITRATE + D3 PO) Take 1 tablet by mouth daily.   hydrocortisone (CORTEF) 5 MG tablet Take 5 mg by mouth daily.   levothyroxine (SYNTHROID, LEVOTHROID) 50 MCG tablet Take 50 mcg by mouth daily.    vitamin B-12 (CYANOCOBALAMIN) 1000 MCG tablet Take 1,000 mcg by mouth daily.    atorvastatin (LIPITOR) 40 MG tablet Take 1 tablet (40 mg total) by mouth daily.   [DISCONTINUED] metoprolol tartrate (LOPRESSOR) 50 MG tablet Take 1 tablet (50 mg total) by mouth once for 1 dose. Take 2 hr prior to CT scan   [DISCONTINUED] ondansetron (ZOFRAN ODT) 4 MG disintegrating tablet Take 1 tablet (4 mg total) by mouth every 8 (eight) hours as needed for nausea or vomiting. (Patient not taking: Reported on 10/25/2022)   No facility-administered encounter medications on file as of 12/16/2022.    Social History: Social History   Tobacco Use   Smoking status: Never    Passive exposure: Never   Smokeless tobacco: Never  Vaping Use   Vaping Use: Never used  Substance Use Topics   Alcohol use: Yes    Comment: socially, rarely   Drug use: No    Family Medical History: Family History  Problem Relation Age of Onset   Cancer Mother        liver   Lymphoma Mother    Breast cancer Neg Hx     Physical Examination: Vitals:   12/16/22 0850  BP: 130/76  Pulse: 63  SpO2: 99%    General: Patient is well developed, well nourished, calm, collected, and in no apparent distress. Attention to examination is appropriate.  Respiratory: Patient is breathing without any difficulty.   NEUROLOGICAL:     Awake, alert, oriented to person, place, and time.  Speech is clear and fluent. Fund of knowledge is appropriate.   Cranial Nerves: Pupils equal round and reactive to light.  Facial tone is symmetric.    Negative pronator drift.   No abnormal lesions on exposed skin.   Strength: Side Biceps Triceps Deltoid Interossei Grip Wrist Ext. Wrist Flex.  R '5 5 5 5 5 5 5  '$ L '5 5 5 5 5 5 5   '$ Side Iliopsoas Quads Hamstring PF DF EHL  R '5 5 5 5 5 5  '$ L '5 5 5 5 5 5   '$ Reflexes are 2+ and symmetric at the biceps, triceps, brachioradialis, patella and achilles.   Hoffman's is absent.  Clonus is not present.   Bilateral upper and lower extremity sensation is intact to light touch.     Gait is  normal.     Medical Decision Making  Imaging: CT of head dated 11/14/22:  FINDINGS: Brain: 3 mm right-sided cerebral convexity subdural hematoma has not significantly changed in size, but is slightly more low density. There is no mass effect or midline shift. There is no new acute intracranial hemorrhage. Ventricles are normal in size. No acute infarct identified.   Vascular: No hyperdense vessel or unexpected calcification.   Skull: Normal. Negative for fracture or focal lesion.   Sinuses/Orbits: No acute finding.   Other: None.   IMPRESSION: Unchanged 3 mm right cerebral convexity subdural hematoma. No mass effect or midline shift.     Electronically Signed   By: Ronney Asters M.D.   On: 11/14/2022 16:09  I  have personally reviewed the images and agree with the above interpretation.  Above imaging also reviewed with Dr. Izora Ribas. He thinks SDH has improved  Assessment and Plan: Ms. Mayabb is a pleasant 77 y.o. female is doing well s/p SDH/TIA 10/25/22. She has no symptoms.   CT shows SDH is unchanged, possibly improved. No concerning findings.   Treatment options discussed with Dr. Izora Ribas and following plan made:   - Activity as tolerated from our standpoint.  - No further imaging needed.  - Follow up with neurology as scheduled.  - Will f/u with me prn.   I spent a total of 20 minutes in face-to-face and non-face-to-face activities related to this patient's care today including review of outside records, review of imaging, review of symptoms, physical exam, discussion of differential diagnosis, discussion of treatment options, and documentation.   Thank you for involving me in the care of this patient.   Geronimo Boot PA-C Dept. of Neurosurgery

## 2022-12-16 ENCOUNTER — Ambulatory Visit (INDEPENDENT_AMBULATORY_CARE_PROVIDER_SITE_OTHER): Payer: PPO | Admitting: Orthopedic Surgery

## 2022-12-16 ENCOUNTER — Encounter: Payer: Self-pay | Admitting: Orthopedic Surgery

## 2022-12-16 VITALS — BP 130/76 | HR 63 | Ht 61.0 in | Wt 108.8 lb

## 2022-12-16 DIAGNOSIS — S065XAA Traumatic subdural hemorrhage with loss of consciousness status unknown, initial encounter: Secondary | ICD-10-CM

## 2023-01-19 DIAGNOSIS — F5101 Primary insomnia: Secondary | ICD-10-CM | POA: Diagnosis not present

## 2023-01-19 DIAGNOSIS — N183 Chronic kidney disease, stage 3 unspecified: Secondary | ICD-10-CM | POA: Diagnosis not present

## 2023-01-19 DIAGNOSIS — S065XAA Traumatic subdural hemorrhage with loss of consciousness status unknown, initial encounter: Secondary | ICD-10-CM | POA: Diagnosis not present

## 2023-01-19 DIAGNOSIS — G459 Transient cerebral ischemic attack, unspecified: Secondary | ICD-10-CM | POA: Diagnosis not present

## 2023-01-19 DIAGNOSIS — Z87898 Personal history of other specified conditions: Secondary | ICD-10-CM | POA: Diagnosis not present

## 2023-02-03 ENCOUNTER — Ambulatory Visit: Payer: PPO | Admitting: Cardiovascular Disease

## 2023-02-10 ENCOUNTER — Encounter: Payer: Self-pay | Admitting: Cardiovascular Disease

## 2023-02-10 ENCOUNTER — Ambulatory Visit: Payer: PPO | Attending: Cardiovascular Disease | Admitting: Cardiovascular Disease

## 2023-02-10 VITALS — BP 108/78 | HR 65 | Ht 61.0 in | Wt 110.1 lb

## 2023-02-10 DIAGNOSIS — I5181 Takotsubo syndrome: Secondary | ICD-10-CM | POA: Diagnosis not present

## 2023-02-10 DIAGNOSIS — E785 Hyperlipidemia, unspecified: Secondary | ICD-10-CM

## 2023-02-10 NOTE — Patient Instructions (Signed)
Medication Instructions:  No changes *If you need a refill on your cardiac medications before your next appointment, please call your pharmacy*   Lab Work: None ordered If you have labs (blood work) drawn today and your tests are completely normal, you will receive your results only by: MyChart Message (if you have MyChart) OR A paper copy in the mail If you have any lab test that is abnormal or we need to change your treatment, we will call you to review the results.   Testing/Procedures: None ordered   Follow-Up: At [facility], you and your health needs are our priority.  As part of our continuing mission to provide you with exceptional heart care, we have created designated Provider Care Teams.  These Care Teams include your primary Cardiologist (physician) and Advanced Practice Providers (APPs -  Physician Assistants and Nurse Practitioners) who all work together to provide you with the care you need, when you need it.  We recommend signing up for the patient portal called "MyChart".  Sign up information is provided on this After Visit Summary.  MyChart is used to connect with patients for Virtual Visits (Telemedicine).  Patients are able to view lab/test results, encounter notes, upcoming appointments, etc.  Non-urgent messages can be sent to your provider as well.   To learn more about what you can do with MyChart, go to [email or web address]://[email or web address].    Your next appointment:   12 month(s)  Provider:   You may see Dr. Arida or one of the following Advanced Practice Providers on your designated Care Team:   Christopher Berge, NP Ryan Dunn, PA-C Cadence Furth, PA-C Sheri Hammock, NP    
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
## 2023-03-03 DIAGNOSIS — D497 Neoplasm of unspecified behavior of endocrine glands and other parts of nervous system: Secondary | ICD-10-CM | POA: Diagnosis not present

## 2023-03-03 DIAGNOSIS — E039 Hypothyroidism, unspecified: Secondary | ICD-10-CM | POA: Diagnosis not present

## 2023-03-03 DIAGNOSIS — E2749 Other adrenocortical insufficiency: Secondary | ICD-10-CM | POA: Diagnosis not present

## 2023-03-03 DIAGNOSIS — E230 Hypopituitarism: Secondary | ICD-10-CM | POA: Diagnosis not present

## 2023-03-13 DIAGNOSIS — E7800 Pure hypercholesterolemia, unspecified: Secondary | ICD-10-CM | POA: Diagnosis not present

## 2023-03-17 DIAGNOSIS — H2513 Age-related nuclear cataract, bilateral: Secondary | ICD-10-CM | POA: Diagnosis not present

## 2023-03-17 DIAGNOSIS — H179 Unspecified corneal scar and opacity: Secondary | ICD-10-CM | POA: Diagnosis not present

## 2023-03-17 DIAGNOSIS — E7800 Pure hypercholesterolemia, unspecified: Secondary | ICD-10-CM | POA: Diagnosis not present

## 2023-03-17 DIAGNOSIS — N1831 Chronic kidney disease, stage 3a: Secondary | ICD-10-CM | POA: Diagnosis not present

## 2023-04-28 ENCOUNTER — Other Ambulatory Visit: Payer: Self-pay | Admitting: Neurological Surgery

## 2023-04-28 DIAGNOSIS — D497 Neoplasm of unspecified behavior of endocrine glands and other parts of nervous system: Secondary | ICD-10-CM

## 2023-06-18 ENCOUNTER — Other Ambulatory Visit: Payer: Self-pay | Admitting: Family Medicine

## 2023-06-18 DIAGNOSIS — Z1231 Encounter for screening mammogram for malignant neoplasm of breast: Secondary | ICD-10-CM

## 2023-06-22 DIAGNOSIS — D0471 Carcinoma in situ of skin of right lower limb, including hip: Secondary | ICD-10-CM | POA: Diagnosis not present

## 2023-06-22 DIAGNOSIS — R208 Other disturbances of skin sensation: Secondary | ICD-10-CM | POA: Diagnosis not present

## 2023-06-22 DIAGNOSIS — D485 Neoplasm of uncertain behavior of skin: Secondary | ICD-10-CM | POA: Diagnosis not present

## 2023-06-22 DIAGNOSIS — S90562A Insect bite (nonvenomous), left ankle, initial encounter: Secondary | ICD-10-CM | POA: Diagnosis not present

## 2023-06-29 DIAGNOSIS — D0471 Carcinoma in situ of skin of right lower limb, including hip: Secondary | ICD-10-CM | POA: Diagnosis not present

## 2023-07-01 ENCOUNTER — Ambulatory Visit
Admission: RE | Admit: 2023-07-01 | Discharge: 2023-07-01 | Disposition: A | Discharge status: Home / Self Care | Payer: PPO | Source: Ambulatory Visit | Attending: Neurological Surgery | Admitting: Neurological Surgery

## 2023-07-01 DIAGNOSIS — G9389 Other specified disorders of brain: Secondary | ICD-10-CM | POA: Diagnosis not present

## 2023-07-01 DIAGNOSIS — D497 Neoplasm of unspecified behavior of endocrine glands and other parts of nervous system: Secondary | ICD-10-CM

## 2023-07-01 MED ORDER — GADOPICLENOL 0.5 MMOL/ML IV SOLN
6.0000 mL | Freq: Once | INTRAVENOUS | Status: AC | PRN
  Administered 2023-07-01: 6 mL via INTRAVENOUS

## 2023-07-08 DIAGNOSIS — D497 Neoplasm of unspecified behavior of endocrine glands and other parts of nervous system: Secondary | ICD-10-CM | POA: Diagnosis not present

## 2023-07-28 DIAGNOSIS — Z87898 Personal history of other specified conditions: Secondary | ICD-10-CM | POA: Diagnosis not present

## 2023-07-28 DIAGNOSIS — S065XAA Traumatic subdural hemorrhage with loss of consciousness status unknown, initial encounter: Secondary | ICD-10-CM | POA: Diagnosis not present

## 2023-07-28 DIAGNOSIS — G459 Transient cerebral ischemic attack, unspecified: Secondary | ICD-10-CM | POA: Diagnosis not present

## 2023-07-29 DIAGNOSIS — L821 Other seborrheic keratosis: Secondary | ICD-10-CM | POA: Diagnosis not present

## 2023-07-29 DIAGNOSIS — D2271 Melanocytic nevi of right lower limb, including hip: Secondary | ICD-10-CM | POA: Diagnosis not present

## 2023-07-29 DIAGNOSIS — D225 Melanocytic nevi of trunk: Secondary | ICD-10-CM | POA: Diagnosis not present

## 2023-07-29 DIAGNOSIS — D2261 Melanocytic nevi of right upper limb, including shoulder: Secondary | ICD-10-CM | POA: Diagnosis not present

## 2023-07-29 DIAGNOSIS — D2272 Melanocytic nevi of left lower limb, including hip: Secondary | ICD-10-CM | POA: Diagnosis not present

## 2023-07-29 DIAGNOSIS — D2262 Melanocytic nevi of left upper limb, including shoulder: Secondary | ICD-10-CM | POA: Diagnosis not present

## 2023-07-29 DIAGNOSIS — L570 Actinic keratosis: Secondary | ICD-10-CM | POA: Diagnosis not present

## 2023-08-04 ENCOUNTER — Ambulatory Visit
Admission: RE | Admit: 2023-08-04 | Discharge: 2023-08-04 | Disposition: A | Discharge status: Home / Self Care | Payer: PPO | Source: Ambulatory Visit | Attending: Family Medicine | Admitting: Family Medicine

## 2023-08-04 DIAGNOSIS — Z1231 Encounter for screening mammogram for malignant neoplasm of breast: Secondary | ICD-10-CM | POA: Diagnosis not present

## 2023-08-20 ENCOUNTER — Ambulatory Visit: Payer: PPO

## 2023-08-27 ENCOUNTER — Ambulatory Visit: Payer: PPO | Admitting: Urology

## 2023-08-28 ENCOUNTER — Ambulatory Visit
Admission: RE | Admit: 2023-08-28 | Discharge: 2023-08-28 | Disposition: A | Discharge status: Home / Self Care | Payer: PPO | Source: Ambulatory Visit | Attending: Urology | Admitting: Urology

## 2023-08-28 DIAGNOSIS — N200 Calculus of kidney: Secondary | ICD-10-CM | POA: Diagnosis not present

## 2023-08-28 DIAGNOSIS — Z0389 Encounter for observation for other suspected diseases and conditions ruled out: Secondary | ICD-10-CM | POA: Diagnosis not present

## 2023-08-28 DIAGNOSIS — Z905 Acquired absence of kidney: Secondary | ICD-10-CM | POA: Insufficient documentation

## 2023-09-15 DIAGNOSIS — N1831 Chronic kidney disease, stage 3a: Secondary | ICD-10-CM | POA: Diagnosis not present

## 2023-09-15 DIAGNOSIS — E7800 Pure hypercholesterolemia, unspecified: Secondary | ICD-10-CM | POA: Diagnosis not present

## 2023-09-22 DIAGNOSIS — Z0000 Encounter for general adult medical examination without abnormal findings: Secondary | ICD-10-CM | POA: Diagnosis not present

## 2023-09-22 DIAGNOSIS — E871 Hypo-osmolality and hyponatremia: Secondary | ICD-10-CM | POA: Diagnosis not present

## 2023-09-22 DIAGNOSIS — N1831 Chronic kidney disease, stage 3a: Secondary | ICD-10-CM | POA: Diagnosis not present

## 2023-09-22 DIAGNOSIS — E7800 Pure hypercholesterolemia, unspecified: Secondary | ICD-10-CM | POA: Diagnosis not present

## 2023-10-01 ENCOUNTER — Ambulatory Visit: Payer: PPO | Admitting: Urology

## 2023-10-20 DIAGNOSIS — H179 Unspecified corneal scar and opacity: Secondary | ICD-10-CM | POA: Diagnosis not present

## 2023-11-05 ENCOUNTER — Ambulatory Visit: Payer: PPO | Admitting: Urology

## 2023-11-05 ENCOUNTER — Encounter: Payer: Self-pay | Admitting: Urology

## 2023-11-05 VITALS — BP 135/84 | HR 93 | Ht 61.0 in

## 2023-11-05 DIAGNOSIS — Z87442 Personal history of urinary calculi: Secondary | ICD-10-CM

## 2023-11-05 DIAGNOSIS — Z905 Acquired absence of kidney: Secondary | ICD-10-CM

## 2023-11-05 DIAGNOSIS — Z09 Encounter for follow-up examination after completed treatment for conditions other than malignant neoplasm: Secondary | ICD-10-CM

## 2023-11-05 DIAGNOSIS — Z87440 Personal history of urinary (tract) infections: Secondary | ICD-10-CM

## 2023-11-05 DIAGNOSIS — N200 Calculus of kidney: Secondary | ICD-10-CM

## 2023-11-05 NOTE — Progress Notes (Signed)
   11/05/2023 4:33 PM   Brianna Hammond 06/15/1946 [id number]  Reason for visit: Follow up solitary kidney, nephrolithiasis, history of microscopic hematuria  HPI: She is a 78 year old female with a history of a left radical nephrectomy for an AML in 2004, as well as a history of right-sided nephrolithiasis in 2005.  She has had no stones since that time.  She also has a 20+ year history of low-grade microscopic hematuria, and has deferred further evaluation with cystoscopy and CT urogram multiple times, and we have discussed the risks and benefits as well as low, but nonzero, risk of missing additional pathology.  She denies any problems over the last year, specifically no episodes of kidney stones or UTIs.  Her husband underwent a HOLEP previously with me and continues to do very well.   I personally reviewed her renal ultrasound dated 08/28/2023 which shows a solitary right kidney with no evidence of hydronephrosis or nephrolithiasis.     We discussed prevention strategies for UTI including cranberry tablet prophylaxis and behavioral strategies.  Return precautions discussed regarding any right-sided flank pain or gross hematuria in the setting of her solitary kidney.  She would like to continue yearly renal ultrasounds in the setting of her history of stones and a solitary kidney.  RTC 1 year with renal ultrasound prior   Sondra Come, MD  [facility] [address], [address] [place], [place] [address] [phone]

## 2023-11-18 DIAGNOSIS — E230 Hypopituitarism: Secondary | ICD-10-CM | POA: Diagnosis not present

## 2023-11-18 DIAGNOSIS — D497 Neoplasm of unspecified behavior of endocrine glands and other parts of nervous system: Secondary | ICD-10-CM | POA: Diagnosis not present

## 2023-11-18 DIAGNOSIS — E039 Hypothyroidism, unspecified: Secondary | ICD-10-CM | POA: Diagnosis not present

## 2023-11-18 DIAGNOSIS — E2749 Other adrenocortical insufficiency: Secondary | ICD-10-CM | POA: Diagnosis not present

## 2023-11-27 ENCOUNTER — Emergency Department: Payer: PPO

## 2023-11-27 ENCOUNTER — Other Ambulatory Visit: Payer: Self-pay

## 2023-11-27 ENCOUNTER — Ambulatory Visit
Admission: EM | Admit: 2023-11-27 | Discharge: 2023-11-27 | Disposition: A | Discharge status: Home / Self Care | Payer: PPO

## 2023-11-27 ENCOUNTER — Emergency Department
Admission: EM | Admit: 2023-11-27 | Discharge: 2023-11-27 | Disposition: A | Discharge status: Home / Self Care | Payer: PPO | Attending: Emergency Medicine | Admitting: Emergency Medicine

## 2023-11-27 DIAGNOSIS — E871 Hypo-osmolality and hyponatremia: Secondary | ICD-10-CM | POA: Diagnosis not present

## 2023-11-27 DIAGNOSIS — E039 Hypothyroidism, unspecified: Secondary | ICD-10-CM | POA: Insufficient documentation

## 2023-11-27 DIAGNOSIS — R197 Diarrhea, unspecified: Secondary | ICD-10-CM | POA: Diagnosis not present

## 2023-11-27 DIAGNOSIS — N189 Chronic kidney disease, unspecified: Secondary | ICD-10-CM | POA: Insufficient documentation

## 2023-11-27 DIAGNOSIS — R112 Nausea with vomiting, unspecified: Secondary | ICD-10-CM | POA: Insufficient documentation

## 2023-11-27 DIAGNOSIS — R5383 Other fatigue: Secondary | ICD-10-CM | POA: Diagnosis not present

## 2023-11-27 DIAGNOSIS — J101 Influenza due to other identified influenza virus with other respiratory manifestations: Secondary | ICD-10-CM | POA: Insufficient documentation

## 2023-11-27 DIAGNOSIS — I959 Hypotension, unspecified: Secondary | ICD-10-CM

## 2023-11-27 HISTORY — DX: Nausea with vomiting, unspecified: R11.2

## 2023-11-27 LAB — CBC WITH DIFFERENTIAL/PLATELET
Abs Immature Granulocytes: 0.01 10*3/uL (ref 0.00–0.07)
Basophils Absolute: 0 10*3/uL (ref 0.0–0.1)
Basophils Relative: 1 %
Eosinophils Absolute: 0.1 10*3/uL (ref 0.0–0.5)
Eosinophils Relative: 4 %
HCT: 36.1 % (ref 36.0–46.0)
Hemoglobin: 12.5 g/dL (ref 12.0–15.0)
Immature Granulocytes: 0 %
Lymphocytes Relative: 33 %
Lymphs Abs: 0.8 10*3/uL (ref 0.7–4.0)
MCH: 27.7 pg (ref 26.0–34.0)
MCHC: 34.6 g/dL (ref 30.0–36.0)
MCV: 80 fL (ref 80.0–100.0)
Monocytes Absolute: 0.3 10*3/uL (ref 0.1–1.0)
Monocytes Relative: 12 %
Neutro Abs: 1.1 10*3/uL — ABNORMAL LOW (ref 1.7–7.7)
Neutrophils Relative %: 50 %
Platelets: 111 10*3/uL — ABNORMAL LOW (ref 150–400)
RBC: 4.51 MIL/uL (ref 3.87–5.11)
RDW: 12.6 % (ref 11.5–15.5)
WBC: 2.3 10*3/uL — ABNORMAL LOW (ref 4.0–10.5)
nRBC: 0 % (ref 0.0–0.2)

## 2023-11-27 LAB — COMPREHENSIVE METABOLIC PANEL
ALT: 31 U/L (ref 0–44)
AST: 67 U/L — ABNORMAL HIGH (ref 15–41)
Albumin: 4.1 g/dL (ref 3.5–5.0)
Alkaline Phosphatase: 66 U/L (ref 38–126)
Anion gap: 12 (ref 5–15)
BUN: 20 mg/dL (ref 8–23)
CO2: 22 mmol/L (ref 22–32)
Calcium: 8.8 mg/dL — ABNORMAL LOW (ref 8.9–10.3)
Chloride: 92 mmol/L — ABNORMAL LOW (ref 98–111)
Creatinine, Ser: 1.04 mg/dL — ABNORMAL HIGH (ref 0.44–1.00)
GFR, Estimated: 56 mL/min — ABNORMAL LOW (ref >60)
Glucose, Bld: 74 mg/dL (ref 70–99)
Potassium: 4.1 mmol/L (ref 3.5–5.1)
Sodium: 126 mmol/L — ABNORMAL LOW (ref 135–145)
Total Bilirubin: 1.1 mg/dL (ref 0.0–1.2)
Total Protein: 7.1 g/dL (ref 6.5–8.1)

## 2023-11-27 LAB — RESP PANEL BY RT-PCR (RSV, FLU A&B, COVID)  RVPGX2
Influenza A by PCR: POSITIVE — AB
Influenza B by PCR: NEGATIVE
Resp Syncytial Virus by PCR: NEGATIVE
SARS Coronavirus 2 by RT PCR: NEGATIVE

## 2023-11-27 MED ORDER — ONDANSETRON HCL 4 MG/2ML IJ SOLN
4.0000 mg | Freq: Once | INTRAMUSCULAR | Status: AC
  Administered 2023-11-27: 4 mg via INTRAVENOUS
  Filled 2023-11-27: qty 2

## 2023-11-27 MED ORDER — ONDANSETRON HCL 4 MG PO TABS: 4.0000 mg | ORAL_TABLET | Freq: Four times a day (QID) | ORAL | 0 refills | Status: AC | PRN

## 2023-11-27 MED ORDER — SODIUM CHLORIDE 0.9 % IV BOLUS
1000.0000 mL | Freq: Once | INTRAVENOUS | Status: AC
  Administered 2023-11-27: 1000 mL via INTRAVENOUS

## 2023-11-27 NOTE — ED Triage Notes (Signed)
Nausea and emesis fatigue x 3 days No abdominal pain or fever

## 2023-11-27 NOTE — ED Provider Notes (Signed)
Brianna Hammond    CSN: [id number] Arrival date & time: 11/27/23  1149      History   Chief Complaint Chief Complaint  Patient presents with   Diarrhea   Emesis   Fatigue    HPI Brianna Hammond is a 78 y.o. female.   78 year old female, Brianna Hammond, presents to urgent care for evaluation of nausea,vomiting and fatigue x 3 days.  Patient states her last episode of vomiting was this morning after drinking some ginger ale and a soda cracker, last episode of diarrhea was in our office today.  States she is unable to keep any fluids down.  PMH: Pituitary adenoma hypothyroidism, hepatitis A, chronic renal failure, pure hypercholesterolemia  The history is provided by the patient. No language interpreter was used.    Past Medical History:  Diagnosis Date   Acquired hypothyroidism 04/04/2016   Age-related osteoporosis without current pathological fracture 09/29/2016   Arthritis    Benign neoplasm of pituitary gland and craniopharyngeal duct (HCC) 11/15/2012   Calculus of kidney 11/15/2012   Chronic renal failure    Fibrocystic breast disease    GERD (gastroesophageal reflux disease)    Hepatitis A    age 38   History of kidney stones    History of pituitary tumor 08/14/2017   Overview:  s/p removal   History of radiation therapy 1/25, 1/27, 1/29, 2/2, 11/15/14   SRS pituitary adenoma 5 Gy in 5 fx, total 25 Gy   Hypothyroid    Infection of urinary tract 11/15/2012   Kidney calculus    Low serum cortisol level 09/10/2016   Microscopic hematuria 11/15/2012   Morgagni hernia    Nausea vomiting and diarrhea 11/27/2023   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   Osteoporosis    Pituitary adenoma (HCC) 2005   Pituitary adenoma (HCC) 10/30/2014   Pure hypercholesterolemia 04/04/2016   Small kidney, unilateral 12/20/2014   TMJ syndrome    Trigeminal herpes zoster    Vaccine counseling 09/10/2016    Patient Active Problem List   Diagnosis Date Noted   Nausea  vomiting and diarrhea 11/27/2023   Hypotension 11/27/2023   TIA (transient ischemic attack) 10/25/2022   SDH (subdural hematoma) (HCC) 10/25/2022   HLD (hyperlipidemia) 10/25/2022   Chronic kidney disease, stage 3a (HCC) 10/25/2022   Hyponatremia 10/25/2022   Hypokalemia 10/25/2022   UTI (urinary tract infection) 10/25/2022   Hypothermia 03/08/2021   Non-recurrent inguinal hernia without obstruction or gangrene    CKD (chronic kidney disease) stage 3, GFR 30-59 ml/min (HCC) 10/26/2017   History of pituitary tumor 08/14/2017   Age-related osteoporosis without current pathological fracture 09/29/2016   Low serum cortisol level 09/10/2016   Encounter for general adult medical examination without abnormal findings 09/10/2016   Morgagni hernia    Pure hypercholesterolemia 04/04/2016   Acquired hypothyroidism 04/04/2016   Small kidney, unilateral 12/20/2014   Pituitary adenoma (HCC) 10/30/2014   Infection of urinary tract 11/15/2012   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   Microscopic hematuria 11/15/2012   Calculus of kidney 11/15/2012   Benign neoplasm of pituitary gland and craniopharyngeal duct (HCC) 11/15/2012    Past Surgical History:  Procedure Laterality Date   BRAIN SURGERY     BREAST BIOPSY Left 1986   neg   COLONOSCOPY     HERNIA REPAIR     morgagnia   HIATAL HERNIA REPAIR N/A 05/01/2016   Procedure: LAPAROSCOPIC REPAIR OF MORGAGNI HERNIA;  Surgeon: Cecelia Byars  Ronnette Juniper, MD;  Location: [facility] ORS;  Service: General;  Laterality: N/A;   INGUINAL HERNIA REPAIR Bilateral 10/28/2017   Procedure: LAPAROSCOPIC BILATERAL INGUINAL HERNIA REPAIR;  Surgeon: Leafy Ro, MD;  Location: [facility] ORS;  Service: General;  Laterality: Bilateral;   NEPHRECTOMY RADICAL Left 08/2003   for metanephric adenoma   TONSILLECTOMY     age 29   TRANSPHENOIDAL PITUITARY RESECTION  03/14/2004   infarcted pituitary tumor with features most suggestive of pituitary adenoma    OB History    No obstetric history on file.      Home Medications    Prior to Admission medications   Medication Sig Start Date End Date Taking? Authorizing Provider  aspirin EC 81 MG tablet Take 81 mg by mouth daily. Every other day   Yes [provider]  atorvastatin (LIPITOR) 20 MG tablet Take 1 tablet by mouth daily. 09/11/23  Yes [provider]  Calcium Citrate-Vitamin D (CALCIUM CITRATE + D3 PO) Take 1 tablet by mouth daily.   Yes [provider]  levothyroxine (SYNTHROID, LEVOTHROID) 50 MCG tablet Take 50 mcg by mouth daily.  09/18/14  Yes [provider]  calcium carbonate (TUMS - DOSED IN MG ELEMENTAL CALCIUM) 500 MG chewable tablet Chew 1 tablet by mouth daily as needed for indigestion or heartburn.    [provider]  hydrocortisone (CORTEF) 5 MG tablet Take 5 mg by mouth daily. 10/19/22   [provider]  vitamin B-12 (CYANOCOBALAMIN) 1000 MCG tablet Take 1,000 mcg by mouth daily.    [provider]  VITAMIN D PO Take by mouth daily in the afternoon.    [provider]    Family History Family History  Problem Relation Age of Onset   Cancer Mother        liver   Lymphoma Mother    Breast cancer Neg Hx     Social History Social History   Tobacco Use   Smoking status: Never    Passive exposure: Never   Smokeless tobacco: Never  Vaping Use   Vaping status: Never Used  Substance Use Topics   Alcohol use: Yes    Comment: socially, rarely   Drug use: No     Allergies   Dexamethasone, Dilaudid [hydromorphone hcl], Dexamethasone sodium phosphate, Ezetimibe, and Latex   Review of Systems Review of Systems  Constitutional:  Positive for activity change, appetite change and fatigue. Negative for fever.  Gastrointestinal:  Positive for diarrhea, nausea and vomiting. Negative for abdominal pain.  All other systems reviewed and are negative.    Physical Exam Triage Vital Signs ED Triage Vitals   Encounter Vitals Group     BP 11/27/23 1215 (!) 71/53     Systolic BP Percentile --      Diastolic BP Percentile --      Pulse Rate 11/27/23 1215 96     Resp 11/27/23 1215 18     Temp 11/27/23 1215 99.4 F (37.4 C)     Temp Source 11/27/23 1215 Oral     SpO2 11/27/23 1215 99 %     Weight --      Height --      Head Circumference --      Peak Flow --      Pain Score 11/27/23 1214 0     Pain Loc --      Pain Education --      Exclude from Growth Chart --    No data found.  Updated Vital Signs BP (!) 89/59 (BP Location: Left Arm)   Pulse 96   Temp 99.4 F (37.4 C) (Oral)   Resp 18   SpO2 99%   Visual Acuity Right Eye Distance:   Left Eye Distance:   Bilateral Distance:    Right Eye Near:   Left Eye Near:    Bilateral Near:     Physical Exam Vitals and nursing note reviewed.  Constitutional:      General: She is not in acute distress.    Appearance: She is well-developed and well-groomed.  HENT:     Head: Normocephalic and atraumatic.  Eyes:     Conjunctiva/sclera: Conjunctivae normal.  Cardiovascular:     Rate and Rhythm: Normal rate and regular rhythm.     Heart sounds: Normal heart sounds. No murmur heard. Pulmonary:     Effort: Pulmonary effort is normal. No respiratory distress.     Breath sounds: Normal breath sounds and air entry.  Abdominal:     General: Bowel sounds are increased.     Palpations: Abdomen is soft.     Tenderness: There is no abdominal tenderness.  Musculoskeletal:        General: No swelling.     Cervical back: Neck supple.  Skin:    General: Skin is warm and dry.     Capillary Refill: Capillary refill takes less than 2 seconds.  Neurological:     General: No focal deficit present.     Mental Status: She is alert and oriented to person, place, and time.     GCS: GCS eye subscore is 4. GCS verbal subscore is 5. GCS motor subscore is 6.     Cranial Nerves: No cranial nerve deficit.     Sensory: No sensory deficit.  Psychiatric:         Attention and Perception: Attention normal.        Mood and Affect: Mood normal.        Speech: Speech normal.        Behavior: Behavior normal. Behavior is cooperative.      UC Treatments / Results  Labs (all labs ordered are listed, but only abnormal results are displayed) Labs Reviewed - No data to display  EKG   Radiology No results found.  Procedures Procedures (including critical care time)  Medications Ordered in UC Medications - No data to display  Initial Impression / Assessment and Plan / UC Course  I have reviewed the triage vital signs and the nursing notes.  Pertinent labs & imaging results that were available during my care of the patient were reviewed by me and considered in my medical decision making (see chart for details).  Clinical Course as of 11/27/23 1300  Fri Nov 27, 2023  1220 Patient sent to the emergency room for further evaluation of hypotension nausea vomiting diarrhea, able to keep any po fluids down,  most likely will need labs and fluids,check for dehydration, no IV fluid replacement available in office today.  Patient is with her husband and elected to go POV. [JD]    Clinical Course User Index [JD] Nikolina Simerson, Para March, NP     Ddx: Hypotension,hypovolemia d/t N,V,D,electrolyte imbalance Final Clinical Impressions(s) / UC Diagnoses   Final diagnoses:  Nausea vomiting and diarrhea  Hypotension, unspecified hypotension type     Discharge Instructions      Go to the emergency room for further evaluation and treatment of hypotension, nausea,vomiting, and diarrhea(labs,IV fluids)     ED Prescriptions  None    PDMP not reviewed this encounter.   Clancy Gourd, NP 11/27/23 1300

## 2023-11-27 NOTE — ED Provider Notes (Signed)
[facility] Provider Note    Event Date/Time   First MD Initiated Contact with Patient 11/27/23 1616     (approximate)   History   Emesis and Weakness   HPI  Brianna Hammond is a 78 year old female with history of hypothyroidism, CKD presenting to the emergency department for evaluation of vomiting and weakness.  Symptoms ongoing for 3 days.  Has also had mild cough and sore throat.  Denies abdominal pain.  No reported fevers or chills.  Has been unable to tolerate p.o. intake.  Initially seen in urgent care.  There, patient was found to be hypotensive with blood pressure 71/53.  She was directed to the ER for further evaluation.     Physical Exam   Triage Vital Signs: ED Triage Vitals [11/27/23 1357]  Encounter Vitals Group     BP 112/71     Systolic BP Percentile      Diastolic BP Percentile      Pulse Rate 90     Resp 17     Temp 98.8 F (37.1 C)     Temp Source Oral     SpO2 100 %     Weight 113 lb (51.3 kg)     Height 5\' 1"  (1.549 m)     Head Circumference      Peak Flow      Pain Score 0     Pain Loc      Pain Education      Exclude from Growth Chart     Most recent vital signs: Vitals:   11/27/23 1800 11/27/23 1900  BP: 117/65 130/73  Pulse: 80 75  Resp:  16  Temp:    SpO2: 100% 100%     General: Awake, interactive  CV:  Regular rate, good peripheral perfusion.  Resp:  Unlabored respirations, lungs clear to auscultation Abd:  Nondistended, soft, nontender to palpation Neuro:  Symmetric facial movement, fluid speech   ED Results / Procedures / Treatments   Labs (all labs ordered are listed, but only abnormal results are displayed) Labs Reviewed  RESP PANEL BY RT-PCR (RSV, FLU A&B, COVID)  RVPGX2 - Abnormal; Notable for the following components:      Result Value   Influenza A by PCR POSITIVE (*)    All other components within normal limits  CBC WITH DIFFERENTIAL/PLATELET - Abnormal; Notable for the following  components:   WBC 2.3 (*)    Platelets 111 (*)    Neutro Abs 1.1 (*)    All other components within normal limits  COMPREHENSIVE METABOLIC PANEL - Abnormal; Notable for the following components:   Sodium 126 (*)    Chloride 92 (*)    Creatinine, Ser 1.04 (*)    Calcium 8.8 (*)    AST 67 (*)    GFR, Estimated 56 (*)    All other components within normal limits  URINALYSIS, ROUTINE W REFLEX MICROSCOPIC     EKG EKG independently reviewed interpreted by myself (ER attending) demonstrates:  EKG demonstrates normal sinus rhythm at a rate of 80, PR 152, QRS 82, QTc 01/13/2018, no acute ST changes  RADIOLOGY Imaging independently reviewed and interpreted by myself demonstrates:  CXR without focal consolidation  PROCEDURES:  Critical Care performed: No  Procedures   MEDICATIONS ORDERED IN ED: Medications  sodium chloride 0.9 % bolus 1,000 mL (0 mLs Intravenous Stopped 11/27/23 1920)  ondansetron (ZOFRAN) injection 4 mg (4 mg Intravenous Given 11/27/23 1647)     IMPRESSION /  MDM / ASSESSMENT AND PLAN / ED COURSE  I reviewed the triage vital signs and the nursing notes.  Differential diagnosis includes, but is not limited to, viral illness, pneumonia, low suspicion acute intra-abdominal process given reassuring abdominal exam.  Patient's presentation is most consistent with acute presentation with potential threat to life or bodily function.  78 year old female presenting to the emergency department for evaluation of vomiting and diarrhea.  Vital stable on presentation here, though reportedly hypotensive at urgent care.  I repeated her blood pressure and remained stable at the time of my initial evaluation.  Labs sent from triage with mild leukopenia.  CMP notable for hyponatremia with sodium 126.  On review of prior records, does appear that patient tends to run low, previously as low as 124 in the last year.  Patient reports that she typically drinks tomato juice in the morning and  Gatorade at night to help her sodium up, but is unable to do so with her recent illness.  Will treat symptomatically with IV fluids and Zofran and plan for p.o. trial following this to determine disposition.  Patient able to tolerate crackers and fluids by mouth after Zofran.  Feels much improved.  I discussed admission to the hospital with her hyponatremia, but patient strongly wished to be discharged home.  Understands risks of worsening hyponatremia including seizures.  Patient voided while she was here, but did not provide a urine sample, does not wish to stay longer to provide 1 and denies any urinary symptoms. With her tolerating p.o., will plan to DC with prescription for Zofran.  Very strict return precautions for worsening symptoms were provided.  She reports she will follow-up with her primary care doctor in the next couple days for reevaluation and likely repeat sodium check.  Patient discharged in stable condition.      FINAL CLINICAL IMPRESSION(S) / ED DIAGNOSES   Final diagnoses:  Nausea vomiting and diarrhea  Hyponatremia     Rx / DC Orders   ED Discharge Orders          Ordered    ondansetron (ZOFRAN) 4 MG tablet  Every 6 hours PRN        11/27/23 2034             Note:  This document was prepared using Dragon voice recognition software and may include unintentional dictation errors.   Trinna Post, MD 11/27/23 2034

## 2023-11-27 NOTE — ED Triage Notes (Signed)
Pt sts that she has been having N/V with weakness for the last couple of days. Pt sts that her son has given her food and she has ate it however pt sts that she vomits it back up in about or less.

## 2023-11-27 NOTE — ED Provider Triage Note (Signed)
Emergency Medicine Provider Triage Evaluation Note  Brianna Hammond , a 78 y.o. female  was evaluated in triage.  Pt complains of about 3 days of generalized fatigue nausea vomiting diarrhea.  Sent by urgent care.  History of solitary kidney.  Review of Systems  Positive:  Negative:   Physical Exam  There were no vitals taken for this visit. Gen:   Awake, no distress   Resp:  Normal effort  MSK:   Moves extremities without difficulty  Other:    Medical Decision Making  Medically screening exam initiated at 1:57 PM.  Appropriate orders placed.  Brianna Hammond was informed that the remainder of the evaluation will be completed by another provider, this initial triage assessment does not replace that evaluation, and the importance of remaining in the ED until their evaluation is complete.  Basic labs chest x-ray.   Christen Bame, [place] 11/27/23 1358

## 2023-11-27 NOTE — Discharge Instructions (Addendum)
You were seen in the emergency department today for your nausea and vomiting.  I suspect you likely have a viral GI illness.  Your testing did suggest you were dehydrated.  Your sodium level was also very low.  It is very importantly follow-up with your primary care doctor in the next few days for reevaluation.  I sent a prescription for nausea medicine to your pharmacy that you can take as needed.  Return to the ER for any new or worsening symptoms.

## 2023-11-27 NOTE — Discharge Instructions (Addendum)
Go to the emergency room for further evaluation and treatment of hypotension, nausea,vomiting, and diarrhea(labs,IV fluids)

## 2023-11-27 NOTE — ED Notes (Signed)
Patient requested it be noted in chart that she has solitary kidney.

## 2023-11-27 NOTE — ED Notes (Signed)
Patient discharged from ED by provider. Discharge instructions reviewed with patient and all questions answered. Patient wheeled from ED in NAD.

## 2024-03-15 DIAGNOSIS — E7800 Pure hypercholesterolemia, unspecified: Secondary | ICD-10-CM | POA: Diagnosis not present

## 2024-03-22 DIAGNOSIS — E7800 Pure hypercholesterolemia, unspecified: Secondary | ICD-10-CM | POA: Diagnosis not present

## 2024-03-22 DIAGNOSIS — E871 Hypo-osmolality and hyponatremia: Secondary | ICD-10-CM | POA: Diagnosis not present

## 2024-04-25 DIAGNOSIS — H179 Unspecified corneal scar and opacity: Secondary | ICD-10-CM | POA: Diagnosis not present

## 2024-04-25 DIAGNOSIS — H2513 Age-related nuclear cataract, bilateral: Secondary | ICD-10-CM | POA: Diagnosis not present

## 2024-04-26 ENCOUNTER — Ambulatory Visit: Attending: Cardiology | Admitting: Cardiology

## 2024-04-26 ENCOUNTER — Encounter: Payer: Self-pay | Admitting: Cardiology

## 2024-04-26 VITALS — BP 114/80 | HR 86 | Ht 61.0 in | Wt 109.4 lb

## 2024-04-26 DIAGNOSIS — S065XAA Traumatic subdural hemorrhage with loss of consciousness status unknown, initial encounter: Secondary | ICD-10-CM

## 2024-04-26 DIAGNOSIS — G459 Transient cerebral ischemic attack, unspecified: Secondary | ICD-10-CM

## 2024-04-26 DIAGNOSIS — E785 Hyperlipidemia, unspecified: Secondary | ICD-10-CM | POA: Diagnosis not present

## 2024-04-26 DIAGNOSIS — E039 Hypothyroidism, unspecified: Secondary | ICD-10-CM | POA: Diagnosis not present

## 2024-04-26 DIAGNOSIS — I5181 Takotsubo syndrome: Secondary | ICD-10-CM
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
## 2024-04-26 NOTE — Patient Instructions (Signed)
 Medication Instructions:  Your physician recommends that you continue on your current medications as directed. Please refer to the Current Medication list given to you today.   *If you need a refill on your cardiac medications before your next appointment, please call your pharmacy*  Lab Work: No labs ordered today  If you have labs (blood work) drawn today and your tests are completely normal, you will receive your results only by: MyChart Message (if you have MyChart) OR A paper copy in the mail If you have any lab test that is abnormal or we need to change your treatment, we will call you to review the results.  Testing/Procedures: No test ordered today   Follow-Up: At [facility], you and your health needs are our priority.  As part of our continuing mission to provide you with exceptional heart care, our providers are all part of one team.  This team includes your primary Cardiologist (physician) and Advanced Practice Providers or APPs (Physician Assistants and Nurse Practitioners) who all work together to provide you with the care you need, when you need it.  Your next appointment:   12 month(s)  Provider:   Antionette Kirks, MD or Ronald Cockayne, NP

## 2024-05-02 DIAGNOSIS — D0439 Carcinoma in situ of skin of other parts of face: Secondary | ICD-10-CM | POA: Diagnosis not present

## 2024-05-02 DIAGNOSIS — D225 Melanocytic nevi of trunk: Secondary | ICD-10-CM | POA: Diagnosis not present

## 2024-05-02 DIAGNOSIS — D2271 Melanocytic nevi of right lower limb, including hip: Secondary | ICD-10-CM | POA: Diagnosis not present

## 2024-05-02 DIAGNOSIS — Z85828 Personal history of other malignant neoplasm of skin: Secondary | ICD-10-CM | POA: Diagnosis not present

## 2024-05-02 DIAGNOSIS — D485 Neoplasm of uncertain behavior of skin: Secondary | ICD-10-CM | POA: Diagnosis not present

## 2024-05-02 DIAGNOSIS — D2262 Melanocytic nevi of left upper limb, including shoulder: Secondary | ICD-10-CM | POA: Diagnosis not present

## 2024-05-02 DIAGNOSIS — D2272 Melanocytic nevi of left lower limb, including hip: Secondary | ICD-10-CM | POA: Diagnosis not present

## 2024-06-01 ENCOUNTER — Encounter: Payer: Self-pay | Admitting: Urology

## 2024-06-06 DIAGNOSIS — N200 Calculus of kidney: Secondary | ICD-10-CM

## 2024-06-06 DIAGNOSIS — Z905 Acquired absence of kidney: Secondary | ICD-10-CM

## 2024-06-20 ENCOUNTER — Other Ambulatory Visit: Payer: Self-pay | Admitting: Family Medicine

## 2024-06-20 DIAGNOSIS — Z1231 Encounter for screening mammogram for malignant neoplasm of breast: Secondary | ICD-10-CM

## 2024-07-20 DIAGNOSIS — D0439 Carcinoma in situ of skin of other parts of face: Secondary | ICD-10-CM | POA: Diagnosis not present

## 2024-07-25 DIAGNOSIS — E230 Hypopituitarism: Secondary | ICD-10-CM | POA: Diagnosis not present

## 2024-07-25 DIAGNOSIS — D497 Neoplasm of unspecified behavior of endocrine glands and other parts of nervous system: Secondary | ICD-10-CM | POA: Diagnosis not present

## 2024-07-25 DIAGNOSIS — E2749 Other adrenocortical insufficiency: Secondary | ICD-10-CM | POA: Diagnosis not present

## 2024-07-25 DIAGNOSIS — M810 Age-related osteoporosis without current pathological fracture: Secondary | ICD-10-CM | POA: Diagnosis not present

## 2024-07-25 DIAGNOSIS — E039 Hypothyroidism, unspecified: Secondary | ICD-10-CM | POA: Diagnosis not present

## 2024-07-27 ENCOUNTER — Encounter

## 2024-08-04 ENCOUNTER — Ambulatory Visit
Admission: RE | Admit: 2024-08-04 | Discharge: 2024-08-04 | Disposition: A | Discharge status: Home / Self Care | Source: Ambulatory Visit | Attending: Family Medicine | Admitting: Family Medicine

## 2024-08-04 DIAGNOSIS — Z1231 Encounter for screening mammogram for malignant neoplasm of breast: Secondary | ICD-10-CM | POA: Insufficient documentation

## 2024-11-02 ENCOUNTER — Ambulatory Visit: Admitting: Urology

## 2024-11-03 ENCOUNTER — Ambulatory Visit: Payer: Self-pay | Admitting: Urology

## 2024-11-30 ENCOUNTER — Ambulatory Visit: Admitting: Urology

## 2024-12-12 ENCOUNTER — Other Ambulatory Visit

## 2024-12-21 ENCOUNTER — Ambulatory Visit: Admitting: Urology
